# Patient Record
Sex: Female | Born: 1985 | Race: Black or African American | Hispanic: No | Marital: Single | Smoking: Never smoker
Health system: Southern US, Community
[De-identification: ages and names within clinical notes are randomized; demographics above are authoritative.]

## PROBLEM LIST (undated history)

## (undated) ENCOUNTER — Inpatient Hospital Stay (HOSPITAL_COMMUNITY): Payer: Self-pay

## (undated) DIAGNOSIS — J45909 Unspecified asthma, uncomplicated: Secondary | ICD-10-CM

## (undated) DIAGNOSIS — D649 Anemia, unspecified: Secondary | ICD-10-CM

## (undated) DIAGNOSIS — K219 Gastro-esophageal reflux disease without esophagitis: Secondary | ICD-10-CM

## (undated) DIAGNOSIS — N801 Endometriosis of ovary: Secondary | ICD-10-CM

## (undated) DIAGNOSIS — N80109 Endometriosis of ovary, unspecified side, unspecified depth: Secondary | ICD-10-CM

## (undated) HISTORY — PX: OTHER SURGICAL HISTORY: SHX169

---
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
## 2011-06-20 ENCOUNTER — Other Ambulatory Visit: Payer: Self-pay | Admitting: Occupational Medicine

## 2011-06-20 ENCOUNTER — Ambulatory Visit
Admission: RE | Admit: 2011-06-20 | Discharge: 2011-06-20 | Disposition: A | Discharge status: Home / Self Care | Payer: No Typology Code available for payment source | Source: Ambulatory Visit | Attending: Occupational Medicine | Admitting: Occupational Medicine

## 2011-06-20 DIAGNOSIS — Z021 Encounter for pre-employment examination: Secondary | ICD-10-CM

## 2012-01-23 ENCOUNTER — Encounter (HOSPITAL_BASED_OUTPATIENT_CLINIC_OR_DEPARTMENT_OTHER): Payer: Self-pay | Admitting: *Deleted

## 2012-01-23 ENCOUNTER — Emergency Department (HOSPITAL_BASED_OUTPATIENT_CLINIC_OR_DEPARTMENT_OTHER)
Admission: EM | Admit: 2012-01-23 | Discharge: 2012-01-23 | Disposition: A | Discharge status: Home / Self Care | Payer: 59 | Attending: Emergency Medicine | Admitting: Emergency Medicine

## 2012-01-23 DIAGNOSIS — T148 Other injury of unspecified body region: Secondary | ICD-10-CM | POA: Insufficient documentation

## 2012-01-23 DIAGNOSIS — J45909 Unspecified asthma, uncomplicated: Secondary | ICD-10-CM | POA: Insufficient documentation

## 2012-01-23 DIAGNOSIS — W57XXXA Bitten or stung by nonvenomous insect and other nonvenomous arthropods, initial encounter: Secondary | ICD-10-CM | POA: Insufficient documentation

## 2012-01-23 DIAGNOSIS — Z91013 Allergy to seafood: Secondary | ICD-10-CM | POA: Insufficient documentation

## 2012-01-23 HISTORY — DX: Unspecified asthma, uncomplicated: J45.909

## 2012-01-23 MED ORDER — PREDNISONE 50 MG PO TABS
60.0000 mg | ORAL_TABLET | Freq: Once | ORAL | Status: AC
  Administered 2012-01-23: 60 mg via ORAL
  Filled 2012-01-23: qty 1

## 2012-01-23 MED ORDER — DIPHENHYDRAMINE HCL 25 MG PO CAPS
25.0000 mg | ORAL_CAPSULE | Freq: Once | ORAL | Status: AC
  Administered 2012-01-23: 25 mg via ORAL
  Filled 2012-01-23: qty 1

## 2012-01-23 NOTE — ED Notes (Signed)
Pt has red, raised area to back of left leg.

## 2012-01-23 NOTE — ED Provider Notes (Signed)
History     CSN: [id number]  Arrival date & time 01/23/12  2053   First MD Initiated Contact with Patient 01/23/12 2135      Chief Complaint  Patient presents with  . Insect Bite    (Consider location/radiation/quality/duration/timing/severity/associated sxs/prior treatment) HPI Comments: Pt states that she was bit by something on the back of her left leg:pt states that she noticed some other area of redness on extremities:tp state that she has a history of serious reactions and she just wanted to make sure that she didn't need any treatment  Patient is a 26 y.o. female presenting with allergic reaction. The history is provided by the patient. No language interpreter was used.  Allergic Reaction The primary symptoms do not include wheezing, nausea or vomiting. The current episode started 6 to 12 hours ago. The problem has not changed since onset. The onset of the reaction was associated with insect bite/sting. Significant symptoms also include itching.    Past Medical History  Diagnosis Date  . Asthma     History reviewed. No pertinent past surgical history.  No family history on file.  History  Substance Use Topics  . Smoking status: Never Smoker   . Smokeless tobacco: Not on file  . Alcohol Use: No    OB History    Grav Para Term Preterm Abortions TAB SAB Ect Mult Living                  Review of Systems  Constitutional: Negative.   Respiratory: Negative for wheezing.   Cardiovascular: Negative.   Gastrointestinal: Negative for nausea and vomiting.  Skin: Positive for itching.  Neurological: Negative.     Allergies  Crab and Other  Home Medications   Current Outpatient Rx  Name Route Sig Dispense Refill  . DICLOFENAC SODIUM 75 MG PO TBEC Oral Take 75 mg by mouth 2 (two) times daily.      BP 118/65  Pulse 55  Temp 98.4 F (36.9 C) (Oral)  Resp 16  Ht 5\' 5"  (1.651 m)  Wt 177 lb (80.287 kg)  BMI 29.45 kg/m2  SpO2 100%  Physical Exam  Nursing  note and vitals reviewed. Constitutional: She appears well-developed and well-nourished.  HENT:  Mouth/Throat: Oropharynx is clear and moist.  Cardiovascular: Normal rate and regular rhythm.   Pulmonary/Chest: Effort normal and breath sounds normal.  Musculoskeletal: Normal range of motion.  Neurological: She is alert.  Skin:       Pt has to localized area of redness to the left posterior thigh and on on the right forearm    ED Course  Procedures (including critical care time)  Labs Reviewed - No data to display No results found.   1. Insect bite       MDM  Localized reaction:pt not having any breathing problem        Teressa Lower, NP 01/23/12 2201

## 2012-01-23 NOTE — ED Provider Notes (Signed)
Medical screening examination/treatment/procedure(s) were performed by non-physician practitioner and as supervising physician I was immediately available for consultation/collaboration.   Gavin Pound. Oletta Lamas, MD 01/23/12 2206

## 2012-04-10 ENCOUNTER — Other Ambulatory Visit: Payer: Self-pay | Admitting: Physician Assistant

## 2012-04-10 DIAGNOSIS — R1011 Right upper quadrant pain: Secondary | ICD-10-CM

## 2012-04-11 ENCOUNTER — Other Ambulatory Visit: Payer: Self-pay | Admitting: Physician Assistant

## 2012-04-11 ENCOUNTER — Ambulatory Visit
Admission: RE | Admit: 2012-04-11 | Discharge: 2012-04-11 | Disposition: A | Discharge status: Home / Self Care | Payer: 59 | Source: Ambulatory Visit | Attending: Physician Assistant | Admitting: Physician Assistant

## 2012-04-11 DIAGNOSIS — R1011 Right upper quadrant pain: Secondary | ICD-10-CM

## 2012-08-02 ENCOUNTER — Other Ambulatory Visit: Payer: Self-pay | Admitting: Physician Assistant

## 2012-08-02 ENCOUNTER — Other Ambulatory Visit (HOSPITAL_COMMUNITY)
Admission: RE | Admit: 2012-08-02 | Discharge: 2012-08-02 | Disposition: A | Discharge status: Home / Self Care | Payer: 59 | Source: Ambulatory Visit | Attending: Family Medicine | Admitting: Family Medicine

## 2012-08-02 DIAGNOSIS — Z124 Encounter for screening for malignant neoplasm of cervix: Secondary | ICD-10-CM | POA: Insufficient documentation

## 2012-08-08 ENCOUNTER — Ambulatory Visit (INDEPENDENT_AMBULATORY_CARE_PROVIDER_SITE_OTHER): Payer: 59 | Admitting: Physician Assistant

## 2012-08-08 ENCOUNTER — Telehealth: Payer: Self-pay

## 2012-08-08 VITALS — BP 113/70 | HR 87 | Temp 98.0°F | Resp 18 | Ht 66.0 in | Wt 187.8 lb

## 2012-08-08 DIAGNOSIS — J309 Allergic rhinitis, unspecified: Secondary | ICD-10-CM

## 2012-08-08 MED ORDER — MOMETASONE FUROATE 50 MCG/ACT NA SUSP: 2.0000 | Freq: Every day | NASAL | Status: DC

## 2012-08-08 MED ORDER — IPRATROPIUM BROMIDE 0.06 % NA SOLN: 2.0000 | Freq: Three times a day (TID) | NASAL | Status: DC

## 2012-08-08 MED ORDER — PREDNISONE 20 MG PO TABS: ORAL_TABLET | ORAL | Status: DC

## 2012-08-08 MED ORDER — FEXOFENADINE HCL 180 MG PO TABS: 180.0000 mg | ORAL_TABLET | Freq: Every day | ORAL | Status: DC

## 2012-08-08 NOTE — Progress Notes (Signed)
Patient ID: LATONDRA GEBHART MRN: [id number], DOB: [birth year], 27 y.o. Date of Encounter: 08/08/2012, 9:32 AM  Primary Physician: No primary provider on file.  Chief Complaint: Allergic rhinitis  HPI: 27 y.o. year old female with history below presents with flare up of allergic rhinitis. Long history of moderate allergies. Has been through back scratch testing in [place], [place]. "I tested positive for all by three things." Over the past two weeks she has noticed an increase in her nasal congestion, post nasal drip, sneezing, and itchy watery eyes. She has been mouth breathing at night causing a sore throat only in the morning that resolves as the day progresses. Over the past two weeks she had not been taking anything for her allergies until two days ago when she refilled her Nasonex prescription. She does have two indoor dogs, but states that she tolerates their dander well. She does change her air filters regularly and buys allergen filters. She does have a box spring cover on her bed. Uses allergen pillows. She has remained afebrile and without a cough. Her asthma has not been an issue for her in "years."    Past Medical History  Diagnosis Date  . Asthma      Home Meds: Prior to Admission medications   Medication Sig Start Date End Date Taking? Authorizing Provider  diclofenac (VOLTAREN) 75 MG EC tablet Take 75 mg by mouth 2 (two) times daily.    Historical Provider, MD                                Allergies:  Allergies  Allergen Reactions  . Crab (Shellfish Allergy) Other (See Comments)    Tongue swells.  . Other Itching    Pecans    History   Social History  . Marital Status: Single    Spouse Name: N/A    Number of Children: N/A  . Years of Education: N/A   Occupational History  . Not on file.   Social History Main Topics  . Smoking status: Never Smoker   . Smokeless tobacco: Not on file  . Alcohol Use: 1.0 oz/week    2 drink(s) per week  . Drug Use: No  .  Sexually Active: Not on file   Other Topics Concern  . Not on file   Social History Narrative  . No narrative on file     Review of Systems: Constitutional: negative for chills, fever, night sweats, weight changes, or fatigue  HEENT: see above Cardiovascular: negative for chest pain or palpitations Respiratory: negative for hemoptysis, wheezing, shortness of breath, or cough Dermatological: negative for rash Neurologic: negative for headache, dizziness, or syncope   Physical Exam: Blood pressure 113/70, pulse 87, temperature 98 F (36.7 C), temperature source Oral, resp. rate 18, height 5\' 6"  (1.676 m), weight 187 lb 12.8 oz (85.186 kg), last menstrual period 07/25/2012, SpO2 97.00%., Body mass index is 30.31 kg/(m^2). General: Well developed, well nourished, in no acute distress. Head: Normocephalic, atraumatic, eyes without discharge, sclera non-icteric, nares are pale and boggy. Bilateral auditory canals clear, TM's are without perforation, pearly grey and translucent with reflective cone of light bilaterally. Oral cavity moist, posterior pharynx with post nasal drip. No exudate, erythema, or peritonsillar abscess. Uvula midline.  Neck: Supple. No thyromegaly. Full ROM. No lymphadenopathy.  Lungs: Clear bilaterally to auscultation without wheezes, rales, or rhonchi. Breathing is unlabored. Heart: RRR with S1 S2. No murmurs, rubs, or gallops  appreciated. Msk:  Strength and tone normal for age. Extremities/Skin: Warm and dry. No clubbing or cyanosis. No edema. No rashes or suspicious lesions. Neuro: Alert and oriented X 3. Moves all extremities spontaneously. Gait is normal. CNII-XII grossly in tact. Psych:  Responds to questions appropriately with a normal affect.     ASSESSMENT AND PLAN:  27 y.o. year old female with moderate allergic rhinitis. -Prednisone 20 mg #18 3x3, 2x3, 1x3 no RF -Atrovent NS 0.06% 2 sprays each nare bid prn #1 RF 11 -Nasonex 1 spray each nare daily #1  RF 11 -Allegra 180 mg 1 po daily #30 RF 6 -Change to Claritin or Zyrtec after 6 months -Teaspoon of local honey daily -Change air filters regularly -Already has mattress covers -Avoid peak allergen times  Signed, Eula Listen, PA-C 08/08/2012 9:32 AM

## 2012-08-08 NOTE — Telephone Encounter (Signed)
PT STATES SHE HAD RECEIVED ALL HER MEDICINES EXCEPT SINGULAR. PLEASE CALL [phone]

## 2012-08-09 ENCOUNTER — Other Ambulatory Visit: Payer: Self-pay | Admitting: Radiology

## 2012-08-09 MED ORDER — MONTELUKAST SODIUM 10 MG PO TABS: 10.0000 mg | ORAL_TABLET | Freq: Every day | ORAL | Status: DC

## 2012-08-09 NOTE — Telephone Encounter (Signed)
Called patient, advised Rx sent in for her.

## 2012-08-09 NOTE — Telephone Encounter (Signed)
Authorized on 08/09/12.

## 2013-02-04 ENCOUNTER — Encounter (HOSPITAL_BASED_OUTPATIENT_CLINIC_OR_DEPARTMENT_OTHER): Payer: Self-pay | Admitting: Student

## 2013-02-04 ENCOUNTER — Emergency Department (HOSPITAL_BASED_OUTPATIENT_CLINIC_OR_DEPARTMENT_OTHER)
Admission: EM | Admit: 2013-02-04 | Discharge: 2013-02-04 | Disposition: A | Discharge status: Home / Self Care | Payer: 59 | Attending: Emergency Medicine | Admitting: Emergency Medicine

## 2013-02-04 DIAGNOSIS — Y9389 Activity, other specified: Secondary | ICD-10-CM | POA: Insufficient documentation

## 2013-02-04 DIAGNOSIS — M545 Low back pain, unspecified: Secondary | ICD-10-CM

## 2013-02-04 DIAGNOSIS — Z79899 Other long term (current) drug therapy: Secondary | ICD-10-CM | POA: Insufficient documentation

## 2013-02-04 DIAGNOSIS — J45909 Unspecified asthma, uncomplicated: Secondary | ICD-10-CM | POA: Insufficient documentation

## 2013-02-04 DIAGNOSIS — Y92410 Unspecified street and highway as the place of occurrence of the external cause: Secondary | ICD-10-CM | POA: Insufficient documentation

## 2013-02-04 DIAGNOSIS — IMO0002 Reserved for concepts with insufficient information to code with codable children: Secondary | ICD-10-CM | POA: Insufficient documentation

## 2013-02-04 MED ORDER — IBUPROFEN 600 MG PO TABS: 600.0000 mg | ORAL_TABLET | Freq: Four times a day (QID) | ORAL | Status: DC | PRN

## 2013-02-04 MED ORDER — DIAZEPAM 5 MG PO TABS: 5.0000 mg | ORAL_TABLET | Freq: Two times a day (BID) | ORAL | Status: DC

## 2013-02-04 NOTE — ED Provider Notes (Signed)
Medical screening examination/treatment/procedure(s) were performed by non-physician practitioner and as supervising physician I was immediately available for consultation/collaboration.  Nikala Walsworth, MD 02/04/13 1137 
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
## 2013-08-06 ENCOUNTER — Other Ambulatory Visit (HOSPITAL_COMMUNITY)
Admission: RE | Admit: 2013-08-06 | Discharge: 2013-08-06 | Disposition: A | Discharge status: Home / Self Care | Payer: 59 | Source: Ambulatory Visit | Attending: Family Medicine | Admitting: Family Medicine

## 2013-08-06 ENCOUNTER — Other Ambulatory Visit: Payer: Self-pay | Admitting: Physician Assistant

## 2013-08-06 DIAGNOSIS — Z124 Encounter for screening for malignant neoplasm of cervix: Secondary | ICD-10-CM | POA: Insufficient documentation

## 2013-09-05 ENCOUNTER — Encounter (HOSPITAL_COMMUNITY): Payer: Self-pay | Admitting: Emergency Medicine

## 2013-09-05 ENCOUNTER — Emergency Department (HOSPITAL_COMMUNITY)
Admission: EM | Admit: 2013-09-05 | Discharge: 2013-09-05 | Disposition: A | Discharge status: Home / Self Care | Payer: 59 | Attending: Emergency Medicine | Admitting: Emergency Medicine

## 2013-09-05 DIAGNOSIS — J45909 Unspecified asthma, uncomplicated: Secondary | ICD-10-CM | POA: Insufficient documentation

## 2013-09-05 DIAGNOSIS — Z791 Long term (current) use of non-steroidal anti-inflammatories (NSAID): Secondary | ICD-10-CM | POA: Insufficient documentation

## 2013-09-05 DIAGNOSIS — M79662 Pain in left lower leg: Secondary | ICD-10-CM

## 2013-09-05 DIAGNOSIS — M79609 Pain in unspecified limb: Secondary | ICD-10-CM

## 2013-09-05 DIAGNOSIS — Z79899 Other long term (current) drug therapy: Secondary | ICD-10-CM | POA: Insufficient documentation

## 2013-09-05 MED ORDER — NAPROXEN 500 MG PO TABS: 500.0000 mg | ORAL_TABLET | Freq: Two times a day (BID) | ORAL | Status: DC | PRN

## 2013-09-05 NOTE — Progress Notes (Signed)
*  PRELIMINARY RESULTS* Vascular Ultrasound Lower extremity venous duplex has been completed.  Preliminary findings: no evidence of DVT on the Left.   [name], RDMS, RVT  09/05/2013, 11:51 AM

## 2013-09-05 NOTE — ED Notes (Signed)
Pt c/o L calf pain while exercising for past few weeks. She is concerned about a blood clot because she takes birth control, denies hx blood clots. Denies any redness or swelling. States the pain shoots up her leg.

## 2013-09-05 NOTE — Discharge Instructions (Signed)

## 2013-09-05 NOTE — ED Provider Notes (Signed)
CSN: [id number]     Arrival date & time 09/05/13  1017 History   First MD Initiated Contact with Patient 09/05/13 1020     Chief Complaint  Patient presents with  . Leg Pain     (Consider location/radiation/quality/duration/timing/severity/associated sxs/prior Treatment) HPI  28 year old female presents with leg pain.  Pt reports gradual onset of dull pain to her L calf ongoing for nearly a month.  Pain is more noticeable when she ride the elliptical especially with downward motion.  Pain has been present only with certain movement (dorsiflexion) but for the past few days it has shot up her L thigh.  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  Denies fever, chills, rash, redness, cp, sob, numbness or weakness.  Does take birth control pill.  Denies recent surgery, prolonged bed rest, recent travel, prior hx of PE/DVT, hx of cancer or any recent trauma.  No specific treatment tried.   Past Medical History  Diagnosis Date  . Asthma    Past Surgical History  Procedure Laterality Date  . Laparoscopic ovaries      exploratory   No family history on file. History  Substance Use Topics  . Smoking status: Never Smoker   . Smokeless tobacco: Not on file  . Alcohol Use: 1.0 oz/week    2 drink(s) per week   OB History   Grav Para Term Preterm Abortions TAB SAB Ect Mult Living                 Review of Systems  All other systems reviewed and are negative.      Allergies  Crab and Other  Home Medications   Current Outpatient Rx  Name  Route  Sig  Dispense  Refill  . diazepam (VALIUM) 5 MG tablet   Oral   Take 1 tablet (5 mg total) by mouth 2 (two) times daily.   10 tablet   0   . diclofenac (VOLTAREN) 75 MG EC tablet   Oral   Take 75 mg by mouth 2 (two) times daily.         . fexofenadine (ALLEGRA) 180 MG tablet   Oral   Take 1 tablet (180 mg total) by mouth daily.   30 tablet   6   . ibuprofen (ADVIL,MOTRIN) 600 MG tablet    Oral   Take 1 tablet (600 mg total) by mouth every 6 (six) hours as needed for pain.   30 tablet   0    There were no vitals taken for this visit. Physical Exam  Nursing note and vitals reviewed. Constitutional: She appears well-developed and well-nourished. No distress.  HENT:  Head: Atraumatic.  Eyes: Conjunctivae are normal.  Neck: Neck supple.  Cardiovascular: Normal rate, regular rhythm and intact distal pulses.   Pulmonary/Chest: Effort normal and breath sounds normal.  Musculoskeletal: She exhibits tenderness (L calf: minimal tenderness with palpation and with dorsiflexion, no overlying skin changes, no edema, no erythema, no rash.  ). She exhibits no edema.  BLE: no palpable cords, erythema, edema, negative homan sign.    Intact dorsalis pedis and posterior tibialis on L leg.  Neurological: She is alert.  Sensation intact to L leg, patella DTR 2+, no foot drop.  Normal gait  Skin: No rash noted.  Psychiatric: She has a normal mood and affect.    ED Course  Procedures (including critical care time)  10:43 AM Pt here with progressive L  calf pain, no obvious signs of infection, no deformity, no edema but pt does take birth control pill, therefore will obtain doppler US to r/o DVT.  Otherwise pt is NVI.   12:31 PM Doppler shows no evidence of DVT.  Reassurance given.  Recommend RICE as treatment, suspect MSK etiology.  Ortho referral as needed.    Amanda Daniels, Amanda Daniels Female 1986/02/17 [id number]            Progress Notes by Chapman Fitch at 09/05/2013 11:51 AM    Author: Chapman Fitch Service: Vascular Lab Author Type: Cardiovascular Sonographer   Filed: 09/05/2013 11:52 AM Note Time: 09/05/2013 11:51 AM Status: Signed   Editor: Chapman Fitch (Cardiovascular Sonographer)      *PRELIMINARY RESULTS*  Vascular Ultrasound  Lower extremity venous duplex has been completed. Preliminary findings: no evidence of DVT on the Left.  [name], RDMS, RVT  09/05/2013, 11:51 AM          Labs Review Labs Reviewed - No data to display Imaging Review No results found.  EKG Interpretation   None       MDM   Final diagnoses:  Pain of left calf   BP 127/73  Pulse 77  Temp(Src) 98.6 F (37 C) (Oral)  Resp 20  SpO2 98%  I have reviewed nursing notes and vital signs. I personally reviewed the imaging tests through PACS system  I reviewed available ER/hospitalization records thought the EMR     Domenic Moras, [place] 09/05/13 1234

## 2013-09-06 NOTE — ED Provider Notes (Signed)
Medical screening examination/treatment/procedure(s) were performed by non-physician practitioner and as supervising physician I was immediately available for consultation/collaboration.  EKG Interpretation  None    Houston Siren III, MD 09/06/13 [phone]

## 2014-03-03 IMAGING — US US ABDOMEN LIMITED
1 series · 14 of 25 positions shown · non-contrast
Comparison: None.

CLINICAL DATA: Right upper quadrant pain, nausea, diarrhea

LIMITED ABDOMINAL ULTRASOUND - RIGHT UPPER QUADRANT

[Series 1: us abdomen limited · 0.32mm/px · 14 of 36 slices shown]
[im 1/36]
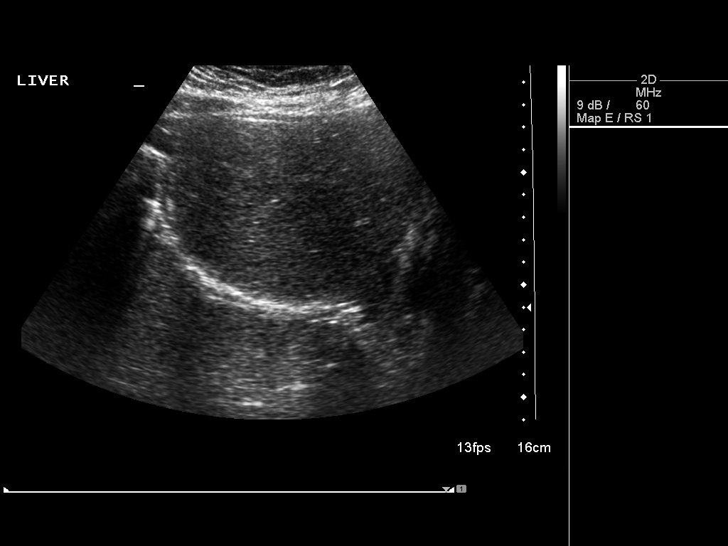
[im 3/36]
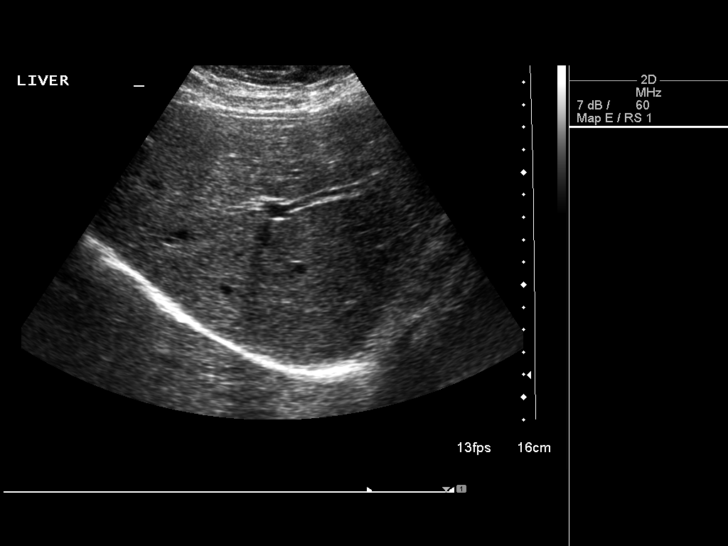
[im 6/36]
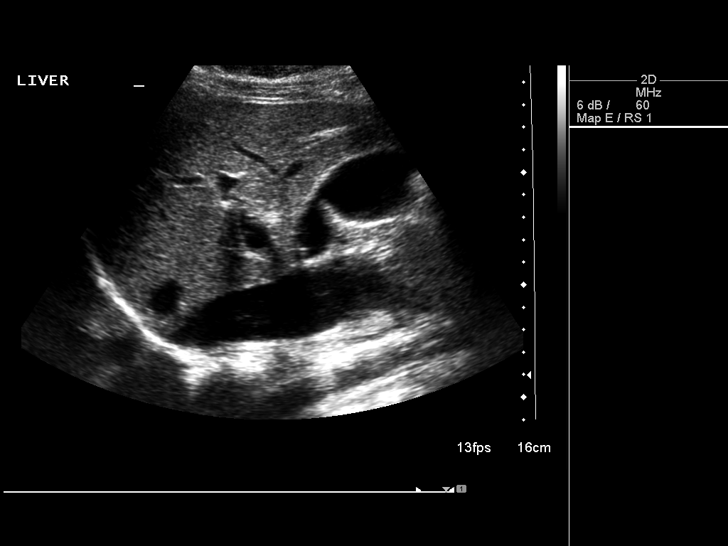
[im 9/36]
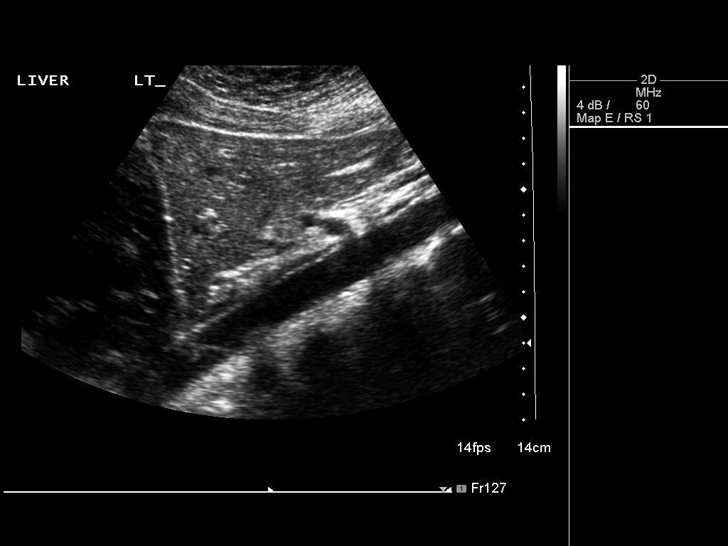
[im 12/36]
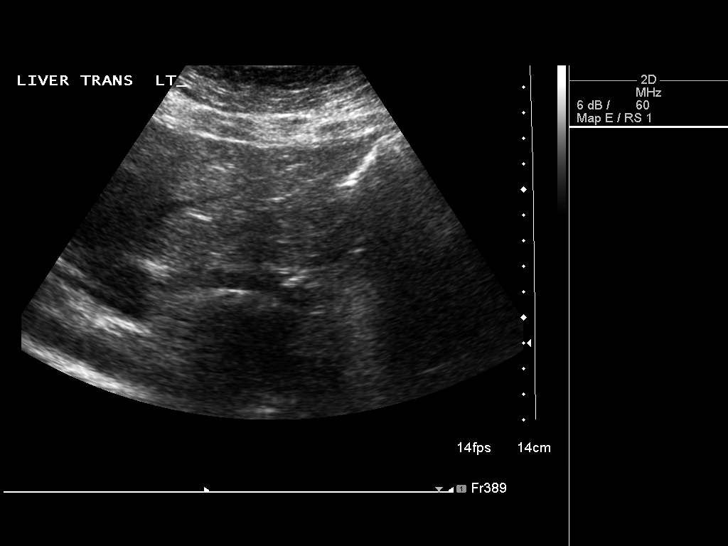
[im 14/36]
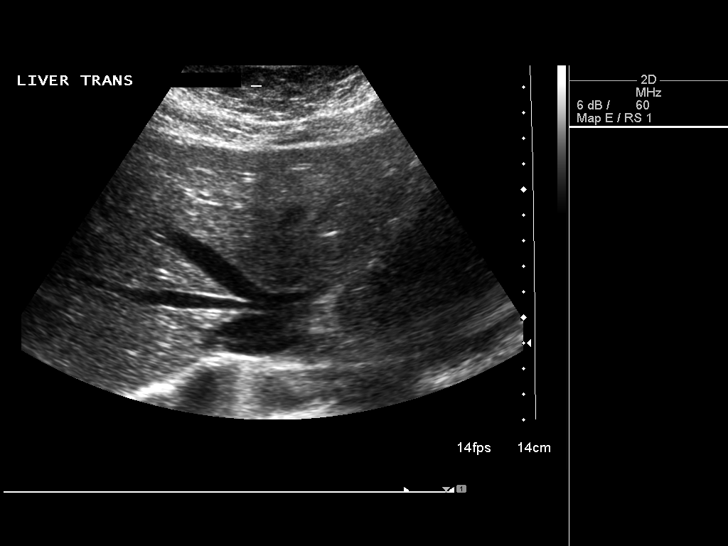
[im 17/36]
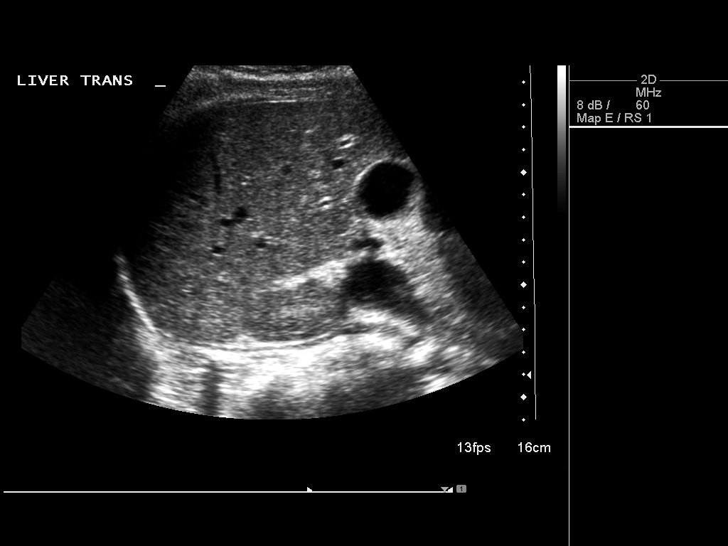
[im 19/36]
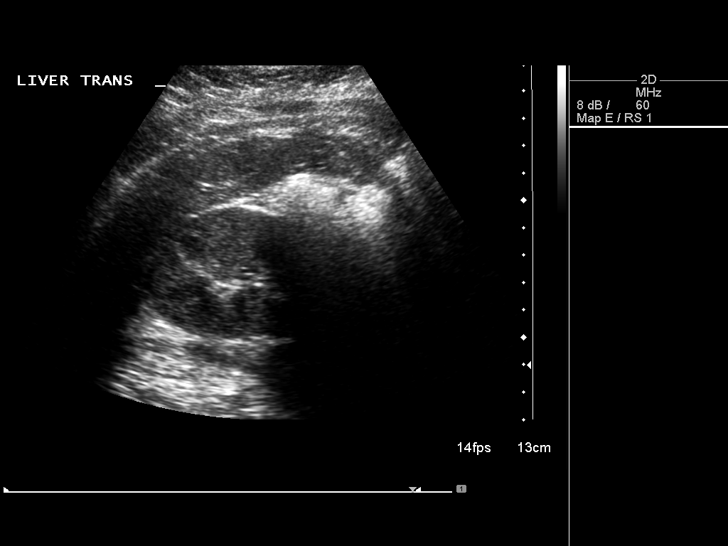
[im 22/36]
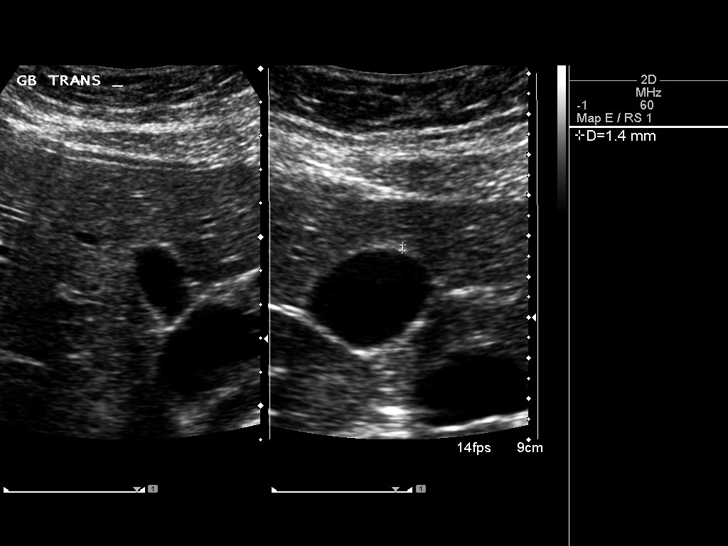
[im 24/36]
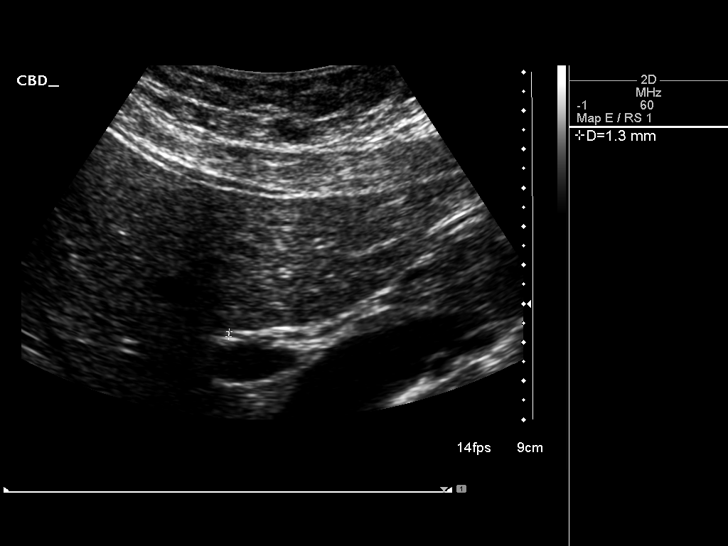
[im 27/36]
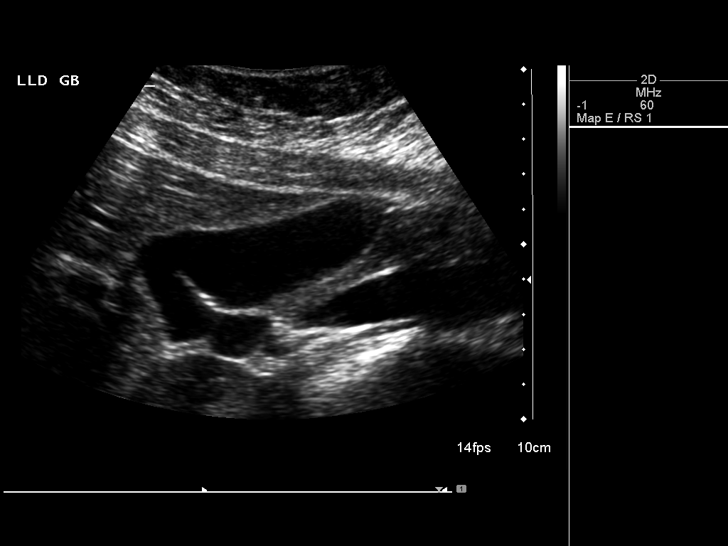
[im 30/36]
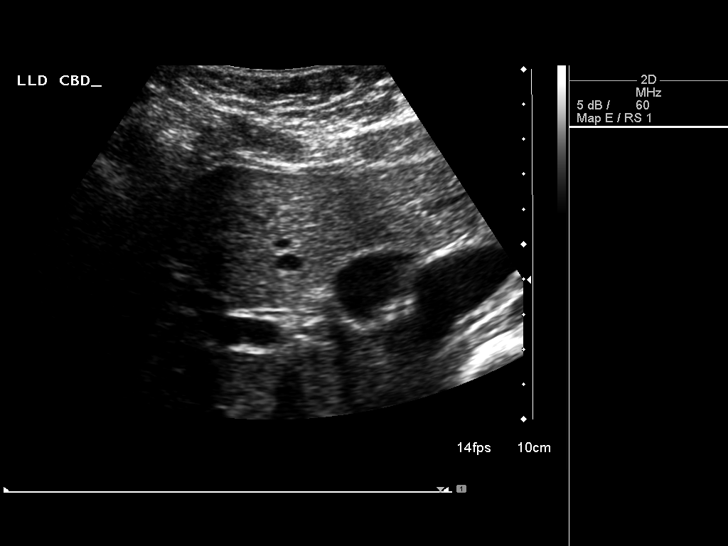
[im 33/36]
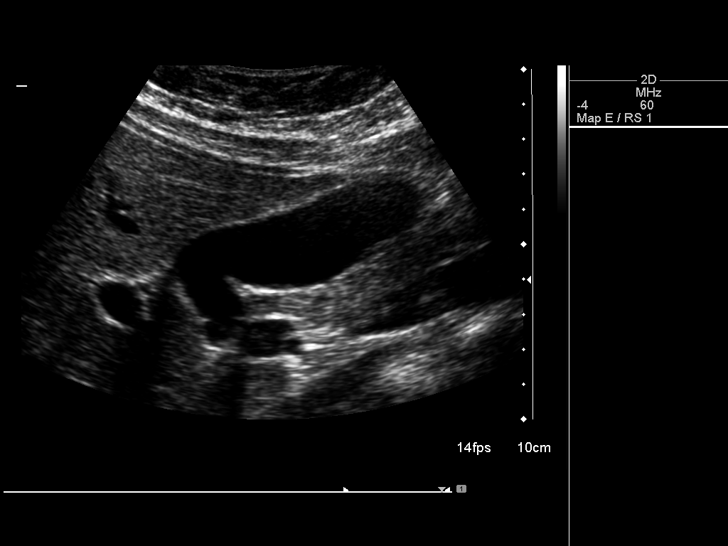
[im 36/36]
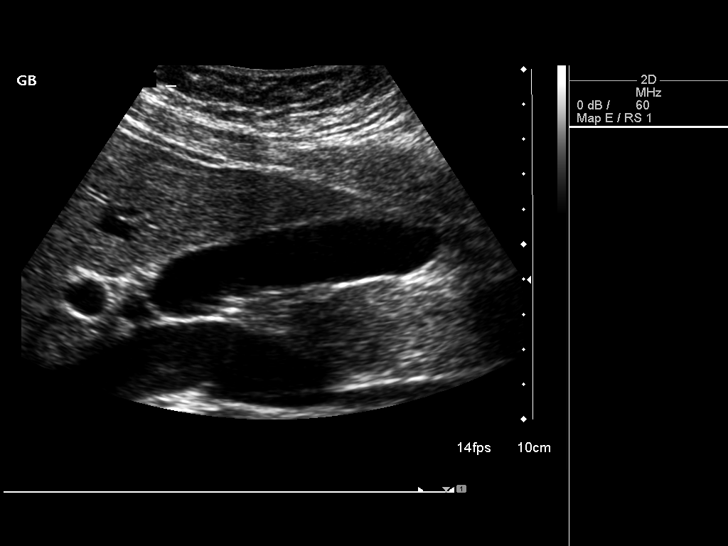

[14 of 25 positions shown; findings below may reference images not displayed]

FINDINGS: Gallbladder:  Normal appearance by ultrasound.  Negative for
gallstones or wall thickening.  No Murphy's sign.  Wall thickness
measures 1.5 mm.

Common bile duct:  1.4 mm diameter.  No dilatation obstruction.

Liver:  Normal echogenicity.  No focal abnormality or biliary
dilatation.

No upper abdominal free fluid.  Included views of the right kidney
demonstrate no hydronephrosis.
IMPRESSION: Normal right upper quadrant ultrasound

## 2016-03-10 ENCOUNTER — Other Ambulatory Visit: Payer: Self-pay | Admitting: Neurosurgery

## 2016-05-09 ENCOUNTER — Encounter (HOSPITAL_COMMUNITY): Payer: Self-pay

## 2016-05-09 ENCOUNTER — Encounter (HOSPITAL_COMMUNITY)
Admission: RE | Admit: 2016-05-09 | Discharge: 2016-05-09 | Disposition: A | Discharge status: Home / Self Care | Payer: 59 | Source: Ambulatory Visit | Attending: Neurosurgery | Admitting: Neurosurgery

## 2016-05-09 DIAGNOSIS — D179 Benign lipomatous neoplasm, unspecified: Secondary | ICD-10-CM | POA: Diagnosis present

## 2016-05-09 HISTORY — DX: Anemia, unspecified: D64.9

## 2016-05-09 HISTORY — DX: Endometriosis of ovary: N80.1

## 2016-05-09 HISTORY — DX: Endometriosis of ovary, unspecified side, unspecified depth: N80.109

## 2016-05-09 HISTORY — DX: Gastro-esophageal reflux disease without esophagitis: K21.9

## 2016-05-09 LAB — CBC WITH DIFFERENTIAL/PLATELET
BASOS PCT: 1 %
Basophils Absolute: 0.1 10*3/uL (ref 0.0–0.1)
EOS ABS: 0.3 10*3/uL (ref 0.0–0.7)
Eosinophils Relative: 3 %
HEMATOCRIT: 35.5 % — AB (ref 36.0–46.0)
HEMOGLOBIN: 11.9 g/dL — AB (ref 12.0–15.0)
Lymphocytes Relative: 24 %
Lymphs Abs: 2 10*3/uL (ref 0.7–4.0)
MCH: 28.8 pg (ref 26.0–34.0)
MCHC: 33.5 g/dL (ref 30.0–36.0)
MCV: 86 fL (ref 78.0–100.0)
MONOS PCT: 6 %
Monocytes Absolute: 0.5 10*3/uL (ref 0.1–1.0)
NEUTROS ABS: 5.4 10*3/uL (ref 1.7–7.7)
NEUTROS PCT: 66 %
Platelets: 309 10*3/uL (ref 150–400)
RBC: 4.13 MIL/uL (ref 3.87–5.11)
RDW: 12.7 % (ref 11.5–15.5)
WBC: 8.2 10*3/uL (ref 4.0–10.5)

## 2016-05-09 LAB — BASIC METABOLIC PANEL
ANION GAP: 7 (ref 5–15)
BUN: 9 mg/dL (ref 6–20)
CALCIUM: 9.2 mg/dL (ref 8.9–10.3)
CHLORIDE: 105 mmol/L (ref 101–111)
CO2: 25 mmol/L (ref 22–32)
Creatinine, Ser: 0.69 mg/dL (ref 0.44–1.00)
GFR calc non Af Amer: >60 mL/min (ref >60)
Glucose, Bld: 84 mg/dL (ref 65–99)
Potassium: 3.9 mmol/L (ref 3.5–5.1)
SODIUM: 137 mmol/L (ref 135–145)

## 2016-05-09 LAB — SURGICAL PCR SCREEN
MRSA, PCR: POSITIVE — AB
STAPHYLOCOCCUS AUREUS: POSITIVE — AB

## 2016-05-09 LAB — HCG, SERUM, QUALITATIVE: Preg, Serum: NEGATIVE
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
## 2016-05-09 NOTE — Pre-Procedure Instructions (Signed)
    Amanda Daniels  05/09/2016      [id number] - [place], [place] - [address] AT [address] [place] [address] Phone: [phone] Fax: [phone]    Your procedure is scheduled on Friday, November 3rd   Report to [facility] Admitting at 6:00 am             (posted surgery time is 8:00 - 10:00 am)   Call this number if you have problems the MORNING of surgery:  [phone]   Remember:  Do not eat food or drink liquids after midnight Thursday.   Take these medicines the morning of surgery with A SIP OF WATER : Allergra, Omeprazole              4-5 days prior to surgery, STOP TAKING any herbal supplements, vitamins, anti-inflammatories, blood thinners   Do not wear jewelry, make-up or nail polish.  Do not wear lotions, powders, perfumes, or deoderant.   Do not shave underarms & legs 48 hours prior to surgery.    Do not bring valuables to the hospital.  [facility] is not responsible for any belongings or valuables.  Contacts, dentures or bridgework may not be worn into surgery.  Leave your suitcase in the car.  After surgery it may be brought to your room. For patients admitted to the hospital, discharge time will be determined by your treatment team.  Please read over the following fact sheets that you were given. Pain Booklet, MRSA Information and Surgical Site Infection Prevention

## 2016-05-09 NOTE — Progress Notes (Signed)
Mup[facility]cin [facility]intment [facility] [address] for positive PCR of MRSA and Staph. Pt notified and voiced understanding.

## 2016-05-13 ENCOUNTER — Encounter (HOSPITAL_COMMUNITY): Payer: Self-pay | Admitting: Urology

## 2016-05-13 ENCOUNTER — Encounter (HOSPITAL_COMMUNITY)
Admission: RE | Disposition: A | Discharge status: Home / Self Care | Payer: Self-pay | Source: Ambulatory Visit | Attending: Neurosurgery

## 2016-05-13 ENCOUNTER — Ambulatory Visit (HOSPITAL_COMMUNITY): Payer: 59 | Admitting: Certified Registered"

## 2016-05-13 ENCOUNTER — Ambulatory Visit (HOSPITAL_COMMUNITY)
Admission: RE | Admit: 2016-05-13 | Discharge: 2016-05-13 | Disposition: A | Discharge status: Home / Self Care | Payer: 59 | Source: Ambulatory Visit | Attending: Neurosurgery | Admitting: Neurosurgery

## 2016-05-13 ENCOUNTER — Ambulatory Visit (HOSPITAL_COMMUNITY): Payer: 59 | Admitting: Emergency Medicine

## 2016-05-13 DIAGNOSIS — K219 Gastro-esophageal reflux disease without esophagitis: Secondary | ICD-10-CM | POA: Diagnosis not present

## 2016-05-13 DIAGNOSIS — G5702 Lesion of sciatic nerve, left lower limb: Secondary | ICD-10-CM | POA: Insufficient documentation

## 2016-05-13 DIAGNOSIS — D649 Anemia, unspecified: Secondary | ICD-10-CM | POA: Insufficient documentation

## 2016-05-13 DIAGNOSIS — Z91013 Allergy to seafood: Secondary | ICD-10-CM | POA: Insufficient documentation

## 2016-05-13 DIAGNOSIS — Z91018 Allergy to other foods: Secondary | ICD-10-CM | POA: Diagnosis not present

## 2016-05-13 DIAGNOSIS — J45909 Unspecified asthma, uncomplicated: Secondary | ICD-10-CM | POA: Insufficient documentation

## 2016-05-13 DIAGNOSIS — Z91040 Latex allergy status: Secondary | ICD-10-CM | POA: Insufficient documentation

## 2016-05-13 HISTORY — PX: SCIATIC NERVE EXPLORATION: SHX5373

## 2016-05-13 SURGERY — SCIATIC NERVE EXPLORATION
Anesthesia: General | Site: Thigh | Laterality: Left

## 2016-05-13 MED ORDER — CYCLOBENZAPRINE HCL 10 MG PO TABS: 10.0000 mg | ORAL_TABLET | Freq: Three times a day (TID) | ORAL | Status: DC | PRN

## 2016-05-13 MED ORDER — CEFAZOLIN SODIUM-DEXTROSE 2-4 GM/100ML-% IV SOLN
INTRAVENOUS | Status: AC
  Filled 2016-05-13: qty 100

## 2016-05-13 MED ORDER — DEXAMETHASONE SODIUM PHOSPHATE 10 MG/ML IJ SOLN
INTRAMUSCULAR | Status: DC | PRN
  Administered 2016-05-13: 5 mg via INTRAVENOUS

## 2016-05-13 MED ORDER — ONDANSETRON HCL 4 MG/2ML IJ SOLN
INTRAMUSCULAR | Status: AC
  Filled 2016-05-13: qty 2

## 2016-05-13 MED ORDER — OXYCODONE HCL 5 MG/5ML PO SOLN: 5.0000 mg | Freq: Once | ORAL | Status: AC | PRN

## 2016-05-13 MED ORDER — PROPOFOL 10 MG/ML IV BOLUS
INTRAVENOUS | Status: DC | PRN
  Administered 2016-05-13: 120 mg via INTRAVENOUS
  Administered 2016-05-13: 20 mg via INTRAVENOUS

## 2016-05-13 MED ORDER — GLYCOPYRROLATE 0.2 MG/ML IJ SOLN
INTRAMUSCULAR | Status: DC | PRN
  Administered 2016-05-13: 0.6 mg via INTRAVENOUS

## 2016-05-13 MED ORDER — HYDROCODONE-ACETAMINOPHEN 5-325 MG PO TABS: 1.0000 | ORAL_TABLET | ORAL | 0 refills | Status: DC | PRN

## 2016-05-13 MED ORDER — DEXAMETHASONE SODIUM PHOSPHATE 10 MG/ML IJ SOLN
INTRAMUSCULAR | Status: AC
  Filled 2016-05-13: qty 1

## 2016-05-13 MED ORDER — GLYCOPYRROLATE 0.2 MG/ML IV SOSY
PREFILLED_SYRINGE | INTRAVENOUS | Status: AC
  Filled 2016-05-13: qty 3

## 2016-05-13 MED ORDER — THROMBIN 5000 UNITS EX SOLR
CUTANEOUS | Status: AC
  Filled 2016-05-13: qty 5000

## 2016-05-13 MED ORDER — MIDAZOLAM HCL 2 MG/2ML IJ SOLN
INTRAMUSCULAR | Status: AC
  Filled 2016-05-13: qty 2

## 2016-05-13 MED ORDER — MIDAZOLAM HCL 2 MG/2ML IJ SOLN
INTRAMUSCULAR | Status: DC | PRN
  Administered 2016-05-13: 2 mg via INTRAVENOUS

## 2016-05-13 MED ORDER — LIDOCAINE HCL (CARDIAC) 20 MG/ML IV SOLN
INTRAVENOUS | Status: DC | PRN
  Administered 2016-05-13: 60 mg via INTRAVENOUS

## 2016-05-13 MED ORDER — LACTATED RINGERS IV SOLN
INTRAVENOUS | Status: DC
  Administered 2016-05-13: 07:00:00 via INTRAVENOUS

## 2016-05-13 MED ORDER — THROMBIN 20000 UNITS EX SOLR
CUTANEOUS | Status: AC
  Filled 2016-05-13: qty 20000

## 2016-05-13 MED ORDER — NORGESTIM-ETH ESTRAD TRIPHASIC 0.18/0.215/0.25 MG-25 MCG PO TABS: 1.0000 | ORAL_TABLET | Freq: Every day | ORAL | Status: DC

## 2016-05-13 MED ORDER — CEFAZOLIN SODIUM-DEXTROSE 2-4 GM/100ML-% IV SOLN
2.0000 g | INTRAVENOUS | Status: AC
  Administered 2016-05-13: 2 g via INTRAVENOUS

## 2016-05-13 MED ORDER — SODIUM CHLORIDE 0.9% FLUSH: 3.0000 mL | INTRAVENOUS | Status: DC | PRN

## 2016-05-13 MED ORDER — HYDROMORPHONE HCL 1 MG/ML IJ SOLN
0.2500 mg | INTRAMUSCULAR | Status: DC | PRN
  Administered 2016-05-13 (×4): 0.5 mg via INTRAVENOUS

## 2016-05-13 MED ORDER — ACETAMINOPHEN 160 MG/5ML PO SOLN
325.0000 mg | ORAL | Status: DC | PRN
  Filled 2016-05-13: qty 20.3

## 2016-05-13 MED ORDER — CHLORHEXIDINE GLUCONATE CLOTH 2 % EX PADS: 6.0000 | MEDICATED_PAD | Freq: Once | CUTANEOUS | Status: DC

## 2016-05-13 MED ORDER — THROMBIN 20000 UNITS EX SOLR
CUTANEOUS | Status: DC | PRN
  Administered 2016-05-13: 09:00:00 via TOPICAL

## 2016-05-13 MED ORDER — BUPIVACAINE HCL (PF) 0.25 % IJ SOLN
INTRAMUSCULAR | Status: DC | PRN
  Administered 2016-05-13: 30 mL

## 2016-05-13 MED ORDER — KETOROLAC TROMETHAMINE 30 MG/ML IJ SOLN
INTRAMUSCULAR | Status: AC
  Filled 2016-05-13: qty 1

## 2016-05-13 MED ORDER — KETOROLAC TROMETHAMINE 30 MG/ML IJ SOLN
INTRAMUSCULAR | Status: DC | PRN
  Administered 2016-05-13: 30 mg via INTRAVENOUS

## 2016-05-13 MED ORDER — ACETAMINOPHEN 325 MG PO TABS: 650.0000 mg | ORAL_TABLET | ORAL | Status: DC | PRN

## 2016-05-13 MED ORDER — HYDROCODONE-ACETAMINOPHEN 5-325 MG PO TABS: 1.0000 | ORAL_TABLET | ORAL | Status: DC | PRN

## 2016-05-13 MED ORDER — FENTANYL CITRATE (PF) 100 MCG/2ML IJ SOLN
INTRAMUSCULAR | Status: DC | PRN
  Administered 2016-05-13: 50 ug via INTRAVENOUS
  Administered 2016-05-13: 100 ug via INTRAVENOUS

## 2016-05-13 MED ORDER — SODIUM CHLORIDE 0.9 % IV SOLN: 250.0000 mL | INTRAVENOUS | Status: DC

## 2016-05-13 MED ORDER — FENTANYL CITRATE (PF) 100 MCG/2ML IJ SOLN
INTRAMUSCULAR | Status: AC
  Filled 2016-05-13: qty 4

## 2016-05-13 MED ORDER — BUPIVACAINE HCL (PF) 0.25 % IJ SOLN
INTRAMUSCULAR | Status: AC
  Filled 2016-05-13: qty 30

## 2016-05-13 MED ORDER — OXYCODONE-ACETAMINOPHEN 5-325 MG PO TABS: 1.0000 | ORAL_TABLET | ORAL | Status: DC | PRN

## 2016-05-13 MED ORDER — BACITRACIN ZINC 500 UNIT/GM EX OINT
TOPICAL_OINTMENT | CUTANEOUS | Status: AC
  Filled 2016-05-13: qty 28.35

## 2016-05-13 MED ORDER — PROPOFOL 10 MG/ML IV BOLUS
INTRAVENOUS | Status: AC
  Filled 2016-05-13: qty 20

## 2016-05-13 MED ORDER — KETOROLAC TROMETHAMINE 30 MG/ML IJ SOLN: 30.0000 mg | Freq: Four times a day (QID) | INTRAMUSCULAR | Status: DC

## 2016-05-13 MED ORDER — LIDOCAINE 2% (20 MG/ML) 5 ML SYRINGE
INTRAMUSCULAR | Status: AC
  Filled 2016-05-13: qty 5

## 2016-05-13 MED ORDER — CEFAZOLIN IN D5W 1 GM/50ML IV SOLN: 1.0000 g | Freq: Three times a day (TID) | INTRAVENOUS | Status: DC

## 2016-05-13 MED ORDER — SODIUM CHLORIDE 0.9 % IR SOLN
Status: DC | PRN
  Administered 2016-05-13: 09:00:00

## 2016-05-13 MED ORDER — MENTHOL 3 MG MT LOZG: 1.0000 | LOZENGE | OROMUCOSAL | Status: DC | PRN

## 2016-05-13 MED ORDER — OXYCODONE HCL 5 MG PO TABS
ORAL_TABLET | ORAL | Status: AC
  Filled 2016-05-13: qty 1

## 2016-05-13 MED ORDER — OXYCODONE HCL 5 MG PO TABS
5.0000 mg | ORAL_TABLET | Freq: Once | ORAL | Status: AC | PRN
  Administered 2016-05-13: 5 mg via ORAL

## 2016-05-13 MED ORDER — HYDROMORPHONE HCL 2 MG/ML IJ SOLN
INTRAMUSCULAR | Status: AC
  Filled 2016-05-13: qty 1

## 2016-05-13 MED ORDER — NEOSTIGMINE METHYLSULFATE 5 MG/5ML IV SOSY
PREFILLED_SYRINGE | INTRAVENOUS | Status: AC
  Filled 2016-05-13: qty 5

## 2016-05-13 MED ORDER — SODIUM CHLORIDE 0.9% FLUSH: 3.0000 mL | Freq: Two times a day (BID) | INTRAVENOUS | Status: DC

## 2016-05-13 MED ORDER — ONDANSETRON HCL 4 MG/2ML IJ SOLN
INTRAMUSCULAR | Status: DC | PRN
  Administered 2016-05-13: 4 mg via INTRAVENOUS

## 2016-05-13 MED ORDER — ACETAMINOPHEN 325 MG PO TABS: 325.0000 mg | ORAL_TABLET | ORAL | Status: DC | PRN

## 2016-05-13 MED ORDER — NEOSTIGMINE METHYLSULFATE 10 MG/10ML IV SOLN
INTRAVENOUS | Status: DC | PRN
  Administered 2016-05-13: 4 mg via INTRAVENOUS

## 2016-05-13 MED ORDER — ACETAMINOPHEN 650 MG RE SUPP: 650.0000 mg | RECTAL | Status: DC | PRN

## 2016-05-13 MED ORDER — ROCURONIUM BROMIDE 10 MG/ML (PF) SYRINGE
PREFILLED_SYRINGE | INTRAVENOUS | Status: AC
  Filled 2016-05-13: qty 10

## 2016-05-13 MED ORDER — ROCURONIUM BROMIDE 100 MG/10ML IV SOLN
INTRAVENOUS | Status: DC | PRN
  Administered 2016-05-13: 10 mg via INTRAVENOUS
  Administered 2016-05-13: 40 mg via INTRAVENOUS

## 2016-05-13 MED ORDER — HYDROMORPHONE HCL 1 MG/ML IJ SOLN: 0.5000 mg | INTRAMUSCULAR | Status: DC | PRN

## 2016-05-13 MED ORDER — 0.9 % SODIUM CHLORIDE (POUR BTL) OPTIME
TOPICAL | Status: DC | PRN
  Administered 2016-05-13: 1000 mL

## 2016-05-13 MED ORDER — PHENOL 1.4 % MT LIQD: 1.0000 | OROMUCOSAL | Status: DC | PRN

## 2016-05-13 MED ORDER — LORATADINE 10 MG PO TABS: 10.0000 mg | ORAL_TABLET | Freq: Every day | ORAL | Status: DC

## 2016-05-13 MED ORDER — ONDANSETRON HCL 4 MG/2ML IJ SOLN
4.0000 mg | INTRAMUSCULAR | Status: DC | PRN
  Administered 2016-05-13: 4 mg via INTRAVENOUS
  Filled 2016-05-13: qty 2

## 2016-05-13 MED ORDER — PANTOPRAZOLE SODIUM 40 MG PO TBEC: 40.0000 mg | DELAYED_RELEASE_TABLET | Freq: Every day | ORAL | Status: DC

## 2016-05-13 SURGICAL SUPPLY — 72 items
BAG DECANTER FOR FLEXI CONT (MISCELLANEOUS) ×2 IMPLANT
BENZOIN TINCTURE PRP APPL 2/3 (GAUZE/BANDAGES/DRESSINGS) ×2 IMPLANT
BLADE CLIPPER SURG (BLADE) IMPLANT
BLADE SURG 11 STRL SS (BLADE) IMPLANT
BUR CUTTER 7.0 ROUND (BURR) ×2 IMPLANT
BUR MATCHSTICK NEURO 3.0 LAGG (BURR) ×2 IMPLANT
CANISTER SUCT 3000ML PPV (MISCELLANEOUS) ×2 IMPLANT
DERMABOND ADVANCED (GAUZE/BANDAGES/DRESSINGS) ×1
DERMABOND ADVANCED .7 DNX12 (GAUZE/BANDAGES/DRESSINGS) ×1 IMPLANT
DRAPE INCISE IOBAN 66X45 STRL (DRAPES) ×2 IMPLANT
DRAPE LAPAROTOMY 100X72X124 (DRAPES) ×2 IMPLANT
DRAPE LAPAROTOMY T 102X78X121 (DRAPES) IMPLANT
DRAPE MICROSCOPE LEICA (MISCELLANEOUS) ×2 IMPLANT
DRAPE POUCH INSTRU U-SHP 10X18 (DRAPES) ×2 IMPLANT
DRAPE SURG 17X23 STRL (DRAPES) ×4 IMPLANT
DRSG OPSITE POSTOP 4X10 (GAUZE/BANDAGES/DRESSINGS) ×2 IMPLANT
DURAPREP 26ML APPLICATOR (WOUND CARE) ×4 IMPLANT
ELECT REM PT RETURN 9FT ADLT (ELECTROSURGICAL) ×2
ELECTRODE REM PT RTRN 9FT ADLT (ELECTROSURGICAL) ×1 IMPLANT
GAUZE SPONGE 4X4 12PLY STRL (GAUZE/BANDAGES/DRESSINGS) ×2 IMPLANT
GAUZE SPONGE 4X4 16PLY XRAY LF (GAUZE/BANDAGES/DRESSINGS) IMPLANT
GLOVE BIOGEL PI IND STRL 7.0 (GLOVE) ×1 IMPLANT
GLOVE BIOGEL PI IND STRL 7.5 (GLOVE) ×1 IMPLANT
GLOVE BIOGEL PI IND STRL 8.5 (GLOVE) ×1 IMPLANT
GLOVE BIOGEL PI INDICATOR 7.0 (GLOVE) ×1
GLOVE BIOGEL PI INDICATOR 7.5 (GLOVE) ×1
GLOVE BIOGEL PI INDICATOR 8.5 (GLOVE) ×1
GLOVE ECLIPSE 9.0 STRL (GLOVE) IMPLANT
GLOVE EXAM NITRILE LRG STRL (GLOVE) IMPLANT
GLOVE EXAM NITRILE XL STR (GLOVE) IMPLANT
GLOVE EXAM NITRILE XS STR PU (GLOVE) IMPLANT
GLOVE SS PI 9.0 STRL (GLOVE) ×4 IMPLANT
GLOVE SURG SS PI 6.5 STRL IVOR (GLOVE) ×6 IMPLANT
GLOVE SURG SS PI 7.0 STRL IVOR (GLOVE) ×6 IMPLANT
GLOVE SURG SS PI 7.5 STRL IVOR (GLOVE) ×2 IMPLANT
GLOVE SURG SS PI 8.0 STRL IVOR (GLOVE) ×2 IMPLANT
GOWN STRL REUS W/ TWL LRG LVL3 (GOWN DISPOSABLE) ×1 IMPLANT
GOWN STRL REUS W/ TWL XL LVL3 (GOWN DISPOSABLE) ×4 IMPLANT
GOWN STRL REUS W/TWL 2XL LVL3 (GOWN DISPOSABLE) IMPLANT
GOWN STRL REUS W/TWL LRG LVL3 (GOWN DISPOSABLE) ×1
GOWN STRL REUS W/TWL XL LVL3 (GOWN DISPOSABLE) ×4
HEMOSTAT SURGICEL 2X14 (HEMOSTASIS) IMPLANT
KIT BASIN OR (CUSTOM PROCEDURE TRAY) ×2 IMPLANT
KIT ROOM TURNOVER OR (KITS) ×2 IMPLANT
NEEDLE HYPO 22GX1.5 SAFETY (NEEDLE) ×2 IMPLANT
NEEDLE HYPO 25X1 1.5 SAFETY (NEEDLE) ×2 IMPLANT
NEEDLE SPNL 20GX3.5 QUINCKE YW (NEEDLE) IMPLANT
NS IRRIG 1000ML POUR BTL (IV SOLUTION) ×2 IMPLANT
PACK LAMINECTOMY NEURO (CUSTOM PROCEDURE TRAY) ×2 IMPLANT
PATTIES SURGICAL .5 X.5 (GAUZE/BANDAGES/DRESSINGS) IMPLANT
PATTIES SURGICAL .5 X3 (DISPOSABLE) IMPLANT
RUBBERBAND STERILE (MISCELLANEOUS) ×4 IMPLANT
SPONGE LAP 4X18 X RAY DECT (DISPOSABLE) IMPLANT
SPONGE SURGIFOAM ABS GEL 100 (HEMOSTASIS) ×2 IMPLANT
STAPLER VISISTAT 35W (STAPLE) IMPLANT
STRIP CLOSURE SKIN 1/2X4 (GAUZE/BANDAGES/DRESSINGS) ×4 IMPLANT
SUT BONE WAX W31G (SUTURE) IMPLANT
SUT ETHILON 4 0 PS 2 18 (SUTURE) IMPLANT
SUT NURALON 4 0 TR CR/8 (SUTURE) IMPLANT
SUT PROLENE 6 0 BV (SUTURE) IMPLANT
SUT VIC AB 0 CT1 18XCR BRD8 (SUTURE) ×1 IMPLANT
SUT VIC AB 0 CT1 8-18 (SUTURE) ×1
SUT VIC AB 1 CT1 18XCR BRD 8 (SUTURE) ×2 IMPLANT
SUT VIC AB 1 CT1 8-18 (SUTURE) ×2
SUT VIC AB 2-0 CT1 18 (SUTURE) ×2 IMPLANT
SUT VIC AB 3-0 SH 8-18 (SUTURE) ×4 IMPLANT
SUT VICRYL 4-0 PS2 18IN ABS (SUTURE) ×2 IMPLANT
TOWEL OR 17X24 6PK STRL BLUE (TOWEL DISPOSABLE) ×4 IMPLANT
TOWEL OR 17X26 10 PK STRL BLUE (TOWEL DISPOSABLE) ×2 IMPLANT
TRAY FOLEY W/METER SILVER 16FR (SET/KITS/TRAYS/PACK) IMPLANT
TUBE CONNECTING 12X1/4 (SUCTIONS) ×2 IMPLANT
WATER STERILE IRR 1000ML POUR (IV SOLUTION) ×2 IMPLANT

## 2016-05-13 NOTE — Brief Op Note (Signed)
05/13/2016  9:29 AM  PATIENT:  Amanda Daniels  30 y.o. female  PRE-OPERATIVE DIAGNOSIS:  Sciatic nerve lesion  POST-OPERATIVE DIAGNOSIS:  Sciatic nerve lesion  PROCEDURE:  Procedure(s): Left Sciatic Nerve Tumor Resection (Left)  SURGEON:  Surgeon(s) and Role:    * Earnie Larsson, MD - Primary    * Kary Kos, MD - Assisting  PHYSICIAN ASSISTANT:   ASSISTANTS:    ANESTHESIA:   general  EBL:  Total I/O In: 500 [I.V.:500] Out: -   BLOOD ADMINISTERED:none  DRAINS: none   LOCAL MEDICATIONS USED:  MARCAINE     SPECIMEN:  Source of Specimen:  Right thigh  DISPOSITION OF SPECIMEN:  PATHOLOGY  COUNTS:  YES  TOURNIQUET:  * No tourniquets in log *  DICTATION: .Dragon Dictation  PLAN OF CARE: Admit for overnight observation  PATIENT DISPOSITION:  PACU - hemodynamically stable.   Delay start of Pharmacological VTE agent (>24hrs) due to surgical blood loss or risk of bleeding: yes

## 2016-05-13 NOTE — Anesthesia Procedure Notes (Signed)
Procedure Name: Intubation Date/Time: 05/13/2016 8:03 AM Performed by: Sampson Si E Pre-anesthesia Checklist: Patient identified, Emergency Drugs available, Suction available and Patient being monitored Patient Re-evaluated:Patient Re-evaluated prior to inductionOxygen Delivery Method: Circle system utilized Preoxygenation: Pre-oxygenation with 100% oxygen Intubation Type: IV induction Ventilation: Mask ventilation without difficulty Laryngoscope Size: Mac and 3 Grade View: Grade I Tube type: Oral Tube size: 7.0 mm Number of attempts: 1 Airway Equipment and Method: Stylet and Oral airway Placement Confirmation: ETT inserted through vocal cords under direct vision,  positive ETCO2 and breath sounds checked- equal and bilateral Secured at: 21 cm Tube secured with: Tape Dental Injury: Teeth and Oropharynx as per pre-operative assessment

## 2016-05-13 NOTE — Discharge Summary (Signed)
Physician Discharge Summary  Patient ID: Amanda Daniels MRN: [id number] DOB/AGE: [birth year] 30 y.o.  Admit date: 05/13/2016 Discharge date: 05/13/2016  Admission Diagnoses:  Discharge Diagnoses:  Active Problems:   Sciatic nerve lesion, left   Discharged Condition: good  Hospital Course: Patient admitted to hospital where she underwent uncompensated left-sided resection of sciatic nerve tumor. 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 Postoperatively patient is doing well. Neurologically intact. Pain well controlled. Ambulatory. Ready for discharge home.  Consults:   Significant Diagnostic Studies:   Treatments:   Discharge Exam: Blood pressure (!) 102/54, pulse 80, temperature 97.8 F (36.6 C), resp. rate 18, height 5\' 5"  (1.651 m), weight 80 kg (176 lb 5 oz), last menstrual period 04/17/2016, SpO2 100 %. Awake and alert. Oriented and appropriate. Cranial nerve function intact. Motor and sensory function extremities normal. Wound clean and dry. Chest and abdomen benign.  Disposition: 01-Home or Self Care     Medication List    TAKE these medications   COLLAGEN PO Take 1 tablet by mouth daily.   fexofenadine 180 MG tablet Commonly known as:  ALLEGRA Take 180 mg by mouth daily.   Fish Oil 1000 MG Caps Take 1,000 mg by mouth daily.   HYDROcodone-acetaminophen 5-325 MG tablet Commonly known as:  NORCO/VICODIN Take 1-2 tablets by mouth every 4 (four) hours as needed (mild pain).   naproxen 500 MG tablet Commonly known as:  NAPROSYN Take 1 tablet (500 mg total) by mouth 2 (two) times daily as needed for moderate pain.   omeprazole 20 MG capsule Commonly known as:  PRILOSEC Take 20 mg by mouth daily.   ORTHO TRI-CYCLEN LO 0.18/0.215/0.25 MG-25 MCG tab Generic drug:  Norgestimate-Ethinyl Estradiol Triphasic Take 1 tablet by mouth at bedtime.   TURMERIC PO Take 1 tablet by mouth daily.      Follow-up Information    Charlie Pitter, MD  .   Specialty:  Neurosurgery Contact information: [address] [place] [address] [phone]           Signed: Charlie Pitter 05/13/2016, 4:33 PM

## 2016-05-13 NOTE — H&P (Addendum)
Amanda Daniels is an 30 y.o. female.   Chief Complaint: Right thigh pain HPI: 30 year old female with no history of malignancy or neurocutaneous syndrome presents with progressive left thigh pain radiating into her distal thigh leg and foot. Symptoms are aggravated by sitting or physical activity. Workup has demonstrated evidence of a tumor involving her sciatic nerve at the level of the gluteal crease. The patient has no ongoing symptoms of numbness or weakness. She has no other lesions. She presents now for operative exploration hopeful resection of this tumor which appears on imaging to be consistent with a lipoma.  Past Medical History:  Diagnosis Date  . Anemia   . Asthma    last attack 10 yrs ago  . Endometriosis of ovary    back in 2007  . GERD (gastroesophageal reflux disease)     Past Surgical History:  Procedure Laterality Date  . laparoscopic ovaries     exploratory    History reviewed. No pertinent family history. Social History:  reports that she has never smoked. She has never used smokeless tobacco. She reports that she drinks about 1.0 oz of alcohol per week . She reports that she does not use drugs.  Allergies:  Allergies  Allergen Reactions  . Crab [Shellfish Allergy] Other (See Comments)    Tongue swells.  . Latex Itching    inflamation  . Other Itching    Pecans    Medications Prior to Admission  Medication Sig Dispense Refill  . COLLAGEN PO Take 1 tablet by mouth daily.    . fexofenadine (ALLEGRA) 180 MG tablet Take 180 mg by mouth daily.     . Norgestimate-Ethinyl Estradiol Triphasic (ORTHO TRI-CYCLEN LO) 0.18/0.215/0.25 MG-25 MCG tab Take 1 tablet by mouth at bedtime.    . Omega-3 Fatty Acids (FISH OIL) 1000 MG CAPS Take 1,000 mg by mouth daily.     Marland Kitchen omeprazole (PRILOSEC) 20 MG capsule Take 20 mg by mouth daily.    . TURMERIC PO Take 1 tablet by mouth daily.    . naproxen (NAPROSYN) 500 MG tablet Take 1 tablet (500 mg total) by mouth 2 (two) times  daily as needed for moderate pain. (Patient not taking: Reported on 05/05/2016) 30 tablet 0    No results found for this or any previous visit (from the past 48 hour(s)). No results found.  Pertinent items noted in HPI and remainder of comprehensive ROS otherwise negative.  Blood pressure 114/62, pulse (!) 46, temperature 97.6 F (36.4 C), temperature source Oral, resp. rate 20, height 5\' 5"  (1.651 m), weight 80 kg (176 lb 5 oz), last menstrual period 04/17/2016, SpO2 100 %.  The patient is awake and alert. She is oriented and appropriate. Her cranial nerve function is intact. Her speech is fluent. Her judgment and insight are intact. Motor examination 5/5 bilaterally. Sensory examination normal. Reflexes normal. Examination head ears eyes and thirds unremarkable chest and abdomen are benign. Extremities are free from injury deformity. She does have some tenderness at the level of her left gluteal crease with some reproduction of her symptoms with deep palpation. 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 Plan left sciatic nerve exploration with hopeful resection of tumor. Risks and benefits of been explained. Patient wishes to proceed.  Before surgery had begun, I recognized that the consent was for a right sided surgery,I discussed situation with the patient's family. I changed the consent the left side and we proceeded.  Kasheem Toner A 05/13/2016, 7:38 AM

## 2016-05-13 NOTE — Progress Notes (Signed)
Pt doing well. Pt and mother given D/C instructions with Rx's, verbal understanding was provided. Pt's incision is clean and dry with no sign of infection. Pt's IV was removed prior to D/C. Pt D/C'd home via wheelchair @ 1705 per MD order. Pt is stable @ D/C and has no other needs at this time. Holli Humbles, RN

## 2016-05-13 NOTE — Discharge Instructions (Signed)

## 2016-05-13 NOTE — Op Note (Signed)
Date of procedure: 05/13/2016  Date of dictation: Same  Service: Neurosurgery  Preoperative diagnosis: Left sciatic nerve tumor  Postoperative diagnosis: Same  Procedure Name: Exploration of left sciatic nerve in the left buttock and proximal thigh. Resection of neoplasm.  Microdissection  Surgeon:Leotta Weingarten A.Emmersyn Kratzke, M.D.  Asst. Surgeon: Saintclair Halsted  Anesthesia: General  Indication: 30 year old female with left buttock pain with radiation. Workup demonstrates evidence of a fatty mass associated with her left sciatic nerve. Patient presents now for resection.  Operative note: After induction of anesthesia, patient position prone on the bolsters inappropriately padded. Buttock and posterior thigh were prepped and draped sterilely. Curvilinear incision was made over the left buttocks and proximal thigh. Dissection proceeded down to the gluteus maximus muscle. This was partially divided. Dissection then proceeded distally into the proximal thigh and a plane along the medial border of the biceps femoris was also developed. The residual gluteus maximus was elevated and retracted superiorly. I then dissected deep toward the sciatic nerve. I encountered a large fatty mass. I dissected this circumferentially. I identified the sciatic nerve. The tumor itself was attached to the sciatic nerve but did not infiltrate the sciatic nerve. Using the microscope for microdissection I dissected the fatty growth away from the sciatic nerve and resected it completely. There was no evidence of injury to the sciatic nerve or branches. At this point a gross total resection had been achieved. The wound was then irrigated with and like solution. Gluteus maximus was reapproximated with #1 Vicryl in horizontal mattress fashion. Subcutaneous fat and skin were then closed in layers. Steri-Strips and sterile dressing were applied. No apparent complications. Patient tolerated procedure well and she returns to the recovery room  postop.

## 2016-05-13 NOTE — Anesthesia Postprocedure Evaluation (Signed)
Anesthesia Post Note  Patient: Amanda Daniels  Procedure(s) Performed: Procedure(s) (LRB): Left Sciatic Nerve Tumor Resection (Left)  Patient location during evaluation: PACU Anesthesia Type: General Level of consciousness: awake Pain management: pain level controlled Vital Signs Assessment: post-procedure vital signs reviewed and stable Respiratory status: spontaneous breathing Cardiovascular status: stable Postop Assessment: no signs of nausea or vomiting Anesthetic complications: no    Last Vitals:  Vitals:   05/13/16 1042 05/13/16 1045  BP: (!) 100/54   Pulse: (!) 55 (!) 52  Resp: 14 14  Temp: 36.4 C     Last Pain:  Vitals:   05/13/16 1115  TempSrc:   PainSc: 3                  Shannia Jacuinde

## 2016-05-13 NOTE — Anesthesia Preprocedure Evaluation (Addendum)
Anesthesia Evaluation  Patient identified by MRN, date of birth, ID band Patient awake    Reviewed: Allergy & Precautions, NPO status , Patient's Chart, lab work & pertinent test results  History of Anesthesia Complications Negative for: history of anesthetic complications  Airway Mallampati: II  TM Distance: >3 FB Neck ROM: Full    Dental  (+) Teeth Intact, Dental Advisory Given   Pulmonary asthma ,    breath sounds clear to auscultation       Cardiovascular negative cardio ROS   Rhythm:Regular Rate:Normal     Neuro/Psych  Neuromuscular disease negative psych ROS   GI/Hepatic Neg liver ROS, GERD  Medicated and Controlled,  Endo/Other  negative endocrine ROS  Renal/GU negative Renal ROS     Musculoskeletal   Abdominal   Peds  Hematology  (+) anemia ,   Anesthesia Other Findings   Reproductive/Obstetrics                            Anesthesia Physical Anesthesia Plan  ASA: II  Anesthesia Plan: General   Post-op Pain Management:    Induction: Intravenous  Airway Management Planned: Oral ETT  Additional Equipment: None  Intra-op Plan:   Post-operative Plan: Extubation in OR  Informed Consent: I have reviewed the patients History and Physical, chart, labs and discussed the procedure including the risks, benefits and alternatives for the proposed anesthesia with the patient or authorized representative who has indicated his/her understanding and acceptance.   Dental advisory given  Plan Discussed with: CRNA and Surgeon  Anesthesia Plan Comments:         Anesthesia Quick Evaluation

## 2016-05-13 NOTE — Transfer of Care (Signed)
Immediate Anesthesia Transfer of Care Note  Patient: Amanda Daniels  Procedure(s) Performed: Procedure(s): Left Sciatic Nerve Tumor Resection (Left)  Patient Location: PACU  Anesthesia Type:General  Level of Consciousness: awake, alert  and oriented  Airway & Oxygen Therapy: Patient Spontanous Breathing and Patient connected to nasal cannula oxygen  Post-op Assessment: Report given to RN and Patient moving all extremities X 4  Post vital signs: Reviewed and stable  Last Vitals:  Vitals:   05/13/16 0645 05/13/16 0942  BP: 114/62   Pulse: (!) 46   Resp: 20   Temp: 36.4 C (P) 36.1 C    Last Pain:  Vitals:   05/13/16 0707  TempSrc:   PainSc: 6          Complications: No apparent anesthesia complications

## 2016-05-14 ENCOUNTER — Encounter (HOSPITAL_COMMUNITY): Payer: Self-pay | Admitting: Neurosurgery

## 2016-07-14 DIAGNOSIS — N760 Acute vaginitis: Secondary | ICD-10-CM | POA: Diagnosis not present

## 2016-07-14 DIAGNOSIS — R3129 Other microscopic hematuria: Secondary | ICD-10-CM | POA: Diagnosis not present

## 2016-07-22 DIAGNOSIS — R3129 Other microscopic hematuria: Secondary | ICD-10-CM | POA: Diagnosis not present

## 2016-10-04 DIAGNOSIS — R350 Frequency of micturition: Secondary | ICD-10-CM | POA: Diagnosis not present

## 2017-04-06 ENCOUNTER — Ambulatory Visit: Payer: Self-pay

## 2017-04-06 ENCOUNTER — Other Ambulatory Visit: Payer: Self-pay | Admitting: Occupational Medicine

## 2017-04-06 DIAGNOSIS — M79641 Pain in right hand: Secondary | ICD-10-CM

## 2017-04-25 DIAGNOSIS — H04123 Dry eye syndrome of bilateral lacrimal glands: Secondary | ICD-10-CM | POA: Diagnosis not present

## 2017-06-28 DIAGNOSIS — M25512 Pain in left shoulder: Secondary | ICD-10-CM | POA: Diagnosis not present

## 2017-07-05 DIAGNOSIS — M25512 Pain in left shoulder: Secondary | ICD-10-CM | POA: Diagnosis not present

## 2017-07-14 DIAGNOSIS — M25512 Pain in left shoulder: Secondary | ICD-10-CM | POA: Diagnosis not present

## 2017-08-04 DIAGNOSIS — Z01419 Encounter for gynecological examination (general) (routine) without abnormal findings: Secondary | ICD-10-CM | POA: Diagnosis not present

## 2017-09-18 DIAGNOSIS — L700 Acne vulgaris: Secondary | ICD-10-CM | POA: Diagnosis not present

## 2017-09-18 DIAGNOSIS — D225 Melanocytic nevi of trunk: Secondary | ICD-10-CM | POA: Diagnosis not present

## 2017-09-20 DIAGNOSIS — M25512 Pain in left shoulder: Secondary | ICD-10-CM | POA: Diagnosis not present

## 2017-09-20 DIAGNOSIS — M25511 Pain in right shoulder: Secondary | ICD-10-CM | POA: Diagnosis not present

## 2017-09-28 DIAGNOSIS — M24511 Contracture, right shoulder: Secondary | ICD-10-CM | POA: Diagnosis not present

## 2017-09-28 DIAGNOSIS — M9907 Segmental and somatic dysfunction of upper extremity: Secondary | ICD-10-CM | POA: Diagnosis not present

## 2017-09-28 DIAGNOSIS — M9902 Segmental and somatic dysfunction of thoracic region: Secondary | ICD-10-CM | POA: Diagnosis not present

## 2017-10-03 DIAGNOSIS — M9907 Segmental and somatic dysfunction of upper extremity: Secondary | ICD-10-CM | POA: Diagnosis not present

## 2017-10-03 DIAGNOSIS — M9902 Segmental and somatic dysfunction of thoracic region: Secondary | ICD-10-CM | POA: Diagnosis not present

## 2017-10-03 DIAGNOSIS — M24511 Contracture, right shoulder: Secondary | ICD-10-CM | POA: Diagnosis not present

## 2017-11-22 DIAGNOSIS — M545 Low back pain: Secondary | ICD-10-CM | POA: Diagnosis not present

## 2017-11-22 DIAGNOSIS — M25552 Pain in left hip: Secondary | ICD-10-CM | POA: Diagnosis not present

## 2017-11-29 DIAGNOSIS — M25552 Pain in left hip: Secondary | ICD-10-CM | POA: Diagnosis not present

## 2017-11-29 DIAGNOSIS — M545 Low back pain: Secondary | ICD-10-CM | POA: Diagnosis not present

## 2017-12-05 DIAGNOSIS — M545 Low back pain: Secondary | ICD-10-CM | POA: Diagnosis not present

## 2017-12-05 DIAGNOSIS — M25552 Pain in left hip: Secondary | ICD-10-CM | POA: Diagnosis not present

## 2018-01-29 ENCOUNTER — Inpatient Hospital Stay (HOSPITAL_COMMUNITY)
Admission: AD | Admit: 2018-01-29 | Discharge: 2018-01-29 | Disposition: A | Discharge status: Home / Self Care | Payer: 59 | Source: Ambulatory Visit | Attending: Obstetrics & Gynecology | Admitting: Obstetrics & Gynecology

## 2018-01-29 ENCOUNTER — Encounter (HOSPITAL_COMMUNITY): Payer: Self-pay | Admitting: *Deleted

## 2018-01-29 ENCOUNTER — Other Ambulatory Visit: Payer: Self-pay

## 2018-01-29 DIAGNOSIS — Z3A01 Less than 8 weeks gestation of pregnancy: Secondary | ICD-10-CM

## 2018-01-29 DIAGNOSIS — O209 Hemorrhage in early pregnancy, unspecified: Secondary | ICD-10-CM | POA: Diagnosis present

## 2018-01-29 DIAGNOSIS — O200 Threatened abortion: Secondary | ICD-10-CM | POA: Diagnosis not present

## 2018-01-29 LAB — CBC
HEMATOCRIT: 35.7 % — AB (ref 36.0–46.0)
HEMOGLOBIN: 12.1 g/dL (ref 12.0–15.0)
MCH: 29.4 pg (ref 26.0–34.0)
MCHC: 33.9 g/dL (ref 30.0–36.0)
MCV: 86.9 fL (ref 78.0–100.0)
Platelets: 290 10*3/uL (ref 150–400)
RBC: 4.11 MIL/uL (ref 3.87–5.11)
RDW: 13.9 % (ref 11.5–15.5)
WBC: 6.9 10*3/uL (ref 4.0–10.5)

## 2018-01-29 LAB — URINALYSIS, ROUTINE W REFLEX MICROSCOPIC
BILIRUBIN URINE: NEGATIVE
Glucose, UA: NEGATIVE mg/dL
Ketones, ur: NEGATIVE mg/dL
Leukocytes, UA: NEGATIVE
NITRITE: NEGATIVE
Protein, ur: NEGATIVE mg/dL
SPECIFIC GRAVITY, URINE: 1.01 (ref 1.005–1.030)
pH: 7 (ref 5.0–8.0)

## 2018-01-29 LAB — HCG, QUANTITATIVE, PREGNANCY: hCG, Beta Chain, Quant, S: 24 m[IU]/mL — ABNORMAL HIGH (ref <5)

## 2018-01-29 LAB — ABO/RH: ABO/RH(D): B POS

## 2018-01-29 LAB — POCT PREGNANCY, URINE: PREG TEST UR: NEGATIVE

## 2018-01-29 MED ORDER — ACETAMINOPHEN 500 MG PO TABS
1000.0000 mg | ORAL_TABLET | Freq: Once | ORAL | Status: AC
  Administered 2018-01-29: 1000 mg via ORAL
  Filled 2018-01-29: qty 2

## 2018-01-29 NOTE — MAU Provider Note (Signed)
Chief Complaint: Vaginal Bleeding and Possible Pregnancy   First Provider Initiated Contact with Patient 01/29/18 1401     SUBJECTIVE HPI: Amanda Daniels is a 32 y.o. G1P0000 at [redacted]w[redacted]d who presents to [facility] reporting vaginal bleeding and positive pregnancy test. Has had bleeding off and on for the last week. When the bleeding started she was 1 week late for her period. Is not using any contraception. Had light bleeding for 4 days that stopped on Thursday. Bleeding started again yesterday and has been heavy. Is having to change her pad every 2 hours. Not passing clots but is passing stuff that looks like gray tissue. Abdominal cramping since yesterday. Had positive pregnancy test this morning.  Location: lower abdomen Quality: cramping Severity: 5/10 on pain scale Duration: 1 day Timing: intermittent Modifying factors: none, hasn't treated symptoms Associated signs and symptoms: vaginal bleeding  Past Medical History:  Diagnosis Date  . Anemia   . Asthma    last attack 10 yrs ago  . Endometriosis of ovary    back in 2007  . GERD (gastroesophageal reflux disease)    OB History  Gravida Para Term Preterm AB Living  1 0 0 0 0 0  SAB TAB Ectopic Multiple Live Births  0 0 0 0 0    # Outcome Date GA Lbr Len/2nd Weight Sex Delivery Anes PTL Lv  1 Current            Past Surgical History:  Procedure Laterality Date  . laparoscopic ovaries     exploratory  . SCIATIC NERVE EXPLORATION Left 05/13/2016   Procedure: Left Sciatic Nerve Tumor Resection;  Surgeon: Earnie Larsson, MD;  Location: [facility];  Service: Neurosurgery;  Laterality: Left;   Social History   Socioeconomic History  . Marital status: Single    Spouse name: Not on file  . Number of children: Not on file  . Years of education: Not on file  . Highest education level: Not on file  Occupational History  . Not on file  Social Needs  . Financial resource strain: Not on file  . Food insecurity:    Worry: Not on  file    Inability: Not on file  . Transportation needs:    Medical: Not on file    Non-medical: Not on file  Tobacco Use  . Smoking status: Never Smoker  . Smokeless tobacco: Never Used  Substance and Sexual Activity  . Alcohol use: Yes    Alcohol/week: 1.2 oz    Types: 2 Standard drinks or equivalent per week    Comment: one every 2 mths  . Drug use: No  . Sexual activity: Yes    Birth control/protection: None  Lifestyle  . Physical activity:    Days per week: Not on file    Minutes per session: Not on file  . Stress: Not on file  Relationships  . Social connections:    Talks on phone: Not on file    Gets together: Not on file    Attends religious service: Not on file    Active member of club or organization: Not on file    Attends meetings of clubs or organizations: Not on file    Relationship status: Not on file  . Intimate partner violence:    Fear of current or ex partner: Not on file    Emotionally abused: Not on file    Physically abused: Not on file    Forced sexual activity: Not on file  Other  Topics Concern  . Not on file  Social History Narrative  . Not on file   No family history on file. No current facility-administered medications on file prior to encounter.    Current Outpatient Medications on File Prior to Encounter  Medication Sig Dispense Refill  . COLLAGEN PO Take 1 tablet by mouth daily.    . Omega-3 Fatty Acids (FISH OIL) 1000 MG CAPS Take 1,000 mg by mouth daily.     Marland Kitchen omeprazole (PRILOSEC) 20 MG capsule Take 20 mg by mouth daily.    . TURMERIC PO Take 1 tablet by mouth daily.     Allergies  Allergen Reactions  . Crab [Shellfish Allergy] Other (See Comments)    Tongue swells.  . Latex Itching    inflamation  . Other Itching    Pecans    I have reviewed patient's Past Medical Hx, Surgical Hx, Family Hx, Social Hx, medications and allergies.   Review of Systems  Constitutional: Negative.   Gastrointestinal: Positive for abdominal  pain.  Genitourinary: Positive for vaginal bleeding.    OBJECTIVE Patient Vitals for the past 24 hrs:  BP Temp Pulse Resp Height Weight  01/29/18 1552 117/68 - - - - -  01/29/18 1325 129/67 98 F (36.7 C) (!) 51 18 5\' 5"  (1.651 m) 183 lb (83 kg)   Constitutional: Well-developed, well-nourished female in no acute distress.  Cardiovascular: normal rate & rhythm, no murmur Respiratory: normal rate and effort. Lung sounds clear throughout GI: Abd soft, non-tender, Pos BS x 4. No guarding or rebound tenderness MS: Extremities nontender, no edema, normal ROM Neurologic: Alert and oriented x 4.  GU:   SPECULUM EXAM: NEFG, small amount of dark red mucoid blood  BIMANUAL: cervix closed; uterus normal size, no adnexal tenderness or masses.  No CMT.  LAB RESULTS Results for orders placed or performed during the hospital encounter of 01/29/18 (from the past 24 hour(s))  Urinalysis, Routine w reflex microscopic     Status: Abnormal   Collection Time: 01/29/18  1:33 PM  Result Value Ref Range   Color, Urine YELLOW YELLOW   APPearance CLEAR CLEAR   Specific Gravity, Urine 1.010 1.005 - 1.030   pH 7.0 5.0 - 8.0   Glucose, UA NEGATIVE NEGATIVE mg/dL   Hgb urine dipstick LARGE (A) NEGATIVE   Bilirubin Urine NEGATIVE NEGATIVE   Ketones, ur NEGATIVE NEGATIVE mg/dL   Protein, ur NEGATIVE NEGATIVE mg/dL   Nitrite NEGATIVE NEGATIVE   Leukocytes, UA NEGATIVE NEGATIVE   RBC / HPF 0-5 0 - 5 RBC/hpf   WBC, UA 0-5 0 - 5 WBC/hpf   Bacteria, UA RARE (A) NONE SEEN   Squamous Epithelial / LPF 0-5 0 - 5  Pregnancy, urine POC     Status: None   Collection Time: 01/29/18  1:36 PM  Result Value Ref Range   Preg Test, Ur NEGATIVE NEGATIVE  CBC     Status: Abnormal   Collection Time: 01/29/18  2:28 PM  Result Value Ref Range   WBC 6.9 4.0 - 10.5 K/uL   RBC 4.11 3.87 - 5.11 MIL/uL   Hemoglobin 12.1 12.0 - 15.0 g/dL   HCT 35.7 (L) 36.0 - 46.0 %   MCV 86.9 78.0 - 100.0 fL   MCH 29.4 26.0 - 34.0 pg    MCHC 33.9 30.0 - 36.0 g/dL   RDW 13.9 11.5 - 15.5 %   Platelets 290 150 - 400 K/uL  hCG, quantitative, pregnancy     Status: Abnormal  Collection Time: 01/29/18  2:28 PM  Result Value Ref Range   hCG, Beta Chain, Quant, S 24 (H) <5 mIU/mL  ABO/Rh     Status: None (Preliminary result)   Collection Time: 01/29/18  2:28 PM  Result Value Ref Range   ABO/RH(D)      B POS Performed at [facility], [address] [place], [place] [address]     IMAGING No results found.  MAU COURSE Orders Placed This Encounter  Procedures  . Urinalysis, Routine w reflex microscopic  . CBC  . hCG, quantitative, pregnancy  . Pregnancy, urine POC  . ABO/Rh  . Discharge patient   Meds ordered this encounter  Medications  . acetaminophen (TYLENOL) tablet 1,000 mg    MDM Negative UPT CBC, HCG, abo ordered HCG 24 Blood type B positive Hemoglobin stable  C/w Dr. [name]. Pt to have f/u HCG in office on Wednesday  ASSESSMENT 1. Threatened miscarriage   2. Less than [redacted] weeks gestation of pregnancy     PLAN Discharge home in stable condition. Bleeding precautions Call office for f/u lab on Wednesday Pelvic rest  Follow-up Information    Gynecology, [place] Follow up.   Specialty:  Obstetrics and Gynecology Why:  Call office to schedule follow up lab visit on Wednesday Contact information: [address] [place] [address] [phone]          Allergies as of 01/29/2018      Reactions   Crab [shellfish Allergy] Other (See Comments)   Tongue swells.   Latex Itching   inflamation   Other Itching   Pecans      Medication List    TAKE these medications   COLLAGEN PO Take 1 tablet by mouth daily.   Fish Oil 1000 MG Caps Take 1,000 mg by mouth daily.   omeprazole 20 MG capsule Commonly known as:  PRILOSEC Take 20 mg by mouth daily.   TURMERIC PO Take 1 tablet by mouth daily.        Jorje Guild, NP 01/29/2018  7:58 PM

## 2018-01-29 NOTE — Discharge Instructions (Signed)
Return to care   If you have heavier bleeding that soaks through more that 2 pads per hour for an hour or more  If you bleed so much that you feel like you might pass out or you do pass out  If you have significant abdominal pain that is not improved with Tylenol   If you develop a fever > 100.5   Threatened Miscarriage A threatened miscarriage occurs when you have vaginal bleeding during your first 20 weeks of pregnancy but the pregnancy has not ended. If you have vaginal bleeding during this time, your health care provider will do tests to make sure you are still pregnant. If the tests show you are still pregnant and the developing baby (fetus) inside your womb (uterus) is still growing, your condition is considered a threatened miscarriage. A threatened miscarriage does not mean your pregnancy will end, but it does increase the risk of losing your pregnancy (complete miscarriage). What are the causes? The cause of a threatened miscarriage is usually not known. If you go on to have a complete miscarriage, the most common cause is an abnormal number of chromosomes in the developing baby. Chromosomes are the structures inside cells that hold all your genetic material. Some causes of vaginal bleeding that do not result in miscarriage include:  Having sex.  Having an infection.  Normal hormone changes of pregnancy.  Bleeding that occurs when an egg implants in your uterus.  What increases the risk? Risk factors for bleeding in early pregnancy include:  Obesity.  Smoking.  Drinking excessive amounts of alcohol or caffeine.  Recreational drug use.  What are the signs or symptoms?  Light vaginal bleeding.  Mild abdominal pain or cramps. How is this diagnosed? If you have bleeding with or without abdominal pain before 20 weeks of pregnancy, your health care provider will do tests to check whether you are still pregnant. One important test involves using sound waves and a computer  (ultrasound) to create images of the inside of your uterus. Other tests include an internal exam of your vagina and uterus (pelvic exam) and measurement of your babys heart rate. You may be diagnosed with a threatened miscarriage if:  Ultrasound testing shows you are still pregnant.  Your babys heart rate is strong.  A pelvic exam shows that the opening between your uterus and your vagina (cervix) is closed.  Your heart rate and blood pressure are stable.  Blood tests confirm you are still pregnant.  How is this treated? No treatments have been shown to prevent a threatened miscarriage from going on to a complete miscarriage. However, the right home care is important. Follow these instructions at home:  Make sure you keep all your appointments for prenatal care. This is very important.  Get plenty of rest.  Do not have sex or use tampons if you have vaginal bleeding.  Do not douche.  Do not smoke or use recreational drugs.  Do not drink alcohol.  Avoid caffeine. Contact a health care provider if:  You have light vaginal bleeding or spotting while pregnant.  You have abdominal pain or cramping.  You have a fever. Get help right away if:  You have heavy vaginal bleeding.  You have blood clots coming from your vagina.  You have severe low back pain or abdominal cramps.  You have fever, chills, and severe abdominal pain. This information is not intended to replace advice given to you by your health care provider. Make sure you discuss any questions  you have with your health care provider. Document Released: 06/27/2005 Document Revised: 12/03/2015 Document Reviewed: 04/23/2013 Elsevier Interactive Patient Education  [employer].

## 2018-01-29 NOTE — MAU Note (Signed)
Pt presents to MAU with complaints of vaginal bleeding for 7 days. States she took a pregnancy test this morning, she called her OBGYN office and was told to take a pregnancy test. Lower abdominal cramping.

## 2018-01-31 DIAGNOSIS — O039 Complete or unspecified spontaneous abortion without complication: Secondary | ICD-10-CM | POA: Diagnosis not present

## 2018-02-16 DIAGNOSIS — O034 Incomplete spontaneous abortion without complication: Secondary | ICD-10-CM | POA: Diagnosis not present

## 2018-03-14 DIAGNOSIS — M25511 Pain in right shoulder: Secondary | ICD-10-CM | POA: Diagnosis not present

## 2018-03-23 DIAGNOSIS — M25511 Pain in right shoulder: Secondary | ICD-10-CM | POA: Diagnosis not present

## 2018-03-28 DIAGNOSIS — Z3202 Encounter for pregnancy test, result negative: Secondary | ICD-10-CM | POA: Diagnosis not present

## 2018-03-28 DIAGNOSIS — N979 Female infertility, unspecified: Secondary | ICD-10-CM | POA: Diagnosis not present

## 2018-03-28 DIAGNOSIS — N808 Other endometriosis: Secondary | ICD-10-CM | POA: Diagnosis not present

## 2018-03-28 DIAGNOSIS — N809 Endometriosis, unspecified: Secondary | ICD-10-CM | POA: Diagnosis not present

## 2018-03-29 DIAGNOSIS — M25511 Pain in right shoulder: Secondary | ICD-10-CM | POA: Diagnosis not present

## 2018-04-06 DIAGNOSIS — M25511 Pain in right shoulder: Secondary | ICD-10-CM | POA: Diagnosis not present

## 2018-04-10 DIAGNOSIS — M25511 Pain in right shoulder: Secondary | ICD-10-CM | POA: Diagnosis not present

## 2018-04-18 DIAGNOSIS — M25511 Pain in right shoulder: Secondary | ICD-10-CM | POA: Diagnosis not present

## 2018-05-30 DIAGNOSIS — M25511 Pain in right shoulder: Secondary | ICD-10-CM | POA: Diagnosis not present

## 2018-06-25 DIAGNOSIS — N979 Female infertility, unspecified: Secondary | ICD-10-CM | POA: Diagnosis not present

## 2018-06-25 DIAGNOSIS — N939 Abnormal uterine and vaginal bleeding, unspecified: Secondary | ICD-10-CM | POA: Diagnosis not present

## 2018-06-25 DIAGNOSIS — N644 Mastodynia: Secondary | ICD-10-CM | POA: Diagnosis not present

## 2018-08-09 DIAGNOSIS — N979 Female infertility, unspecified: Secondary | ICD-10-CM | POA: Insufficient documentation

## 2018-08-09 DIAGNOSIS — N809 Endometriosis, unspecified: Secondary | ICD-10-CM | POA: Insufficient documentation

## 2018-08-10 DIAGNOSIS — N924 Excessive bleeding in the premenopausal period: Secondary | ICD-10-CM | POA: Diagnosis not present

## 2018-09-20 DIAGNOSIS — N8500 Endometrial hyperplasia, unspecified: Secondary | ICD-10-CM | POA: Diagnosis not present

## 2018-09-20 DIAGNOSIS — Z3202 Encounter for pregnancy test, result negative: Secondary | ICD-10-CM | POA: Diagnosis not present

## 2018-09-20 DIAGNOSIS — N924 Excessive bleeding in the premenopausal period: Secondary | ICD-10-CM | POA: Diagnosis not present

## 2018-09-20 DIAGNOSIS — N920 Excessive and frequent menstruation with regular cycle: Secondary | ICD-10-CM | POA: Diagnosis not present

## 2018-09-20 DIAGNOSIS — N840 Polyp of corpus uteri: Secondary | ICD-10-CM | POA: Diagnosis not present

## 2018-10-01 DIAGNOSIS — L308 Other specified dermatitis: Secondary | ICD-10-CM | POA: Diagnosis not present

## 2018-12-17 ENCOUNTER — Encounter (HOSPITAL_COMMUNITY): Payer: Self-pay

## 2019-02-26 IMAGING — DX DG HAND COMPLETE 3+V*R*
3 series · 3 of 3 positions shown · non-contrast
Comparison: None.

CLINICAL DATA: Pain following crush type injury

EXAM:
RIGHT HAND - COMPLETE 3+ VIEW

[hand pa]
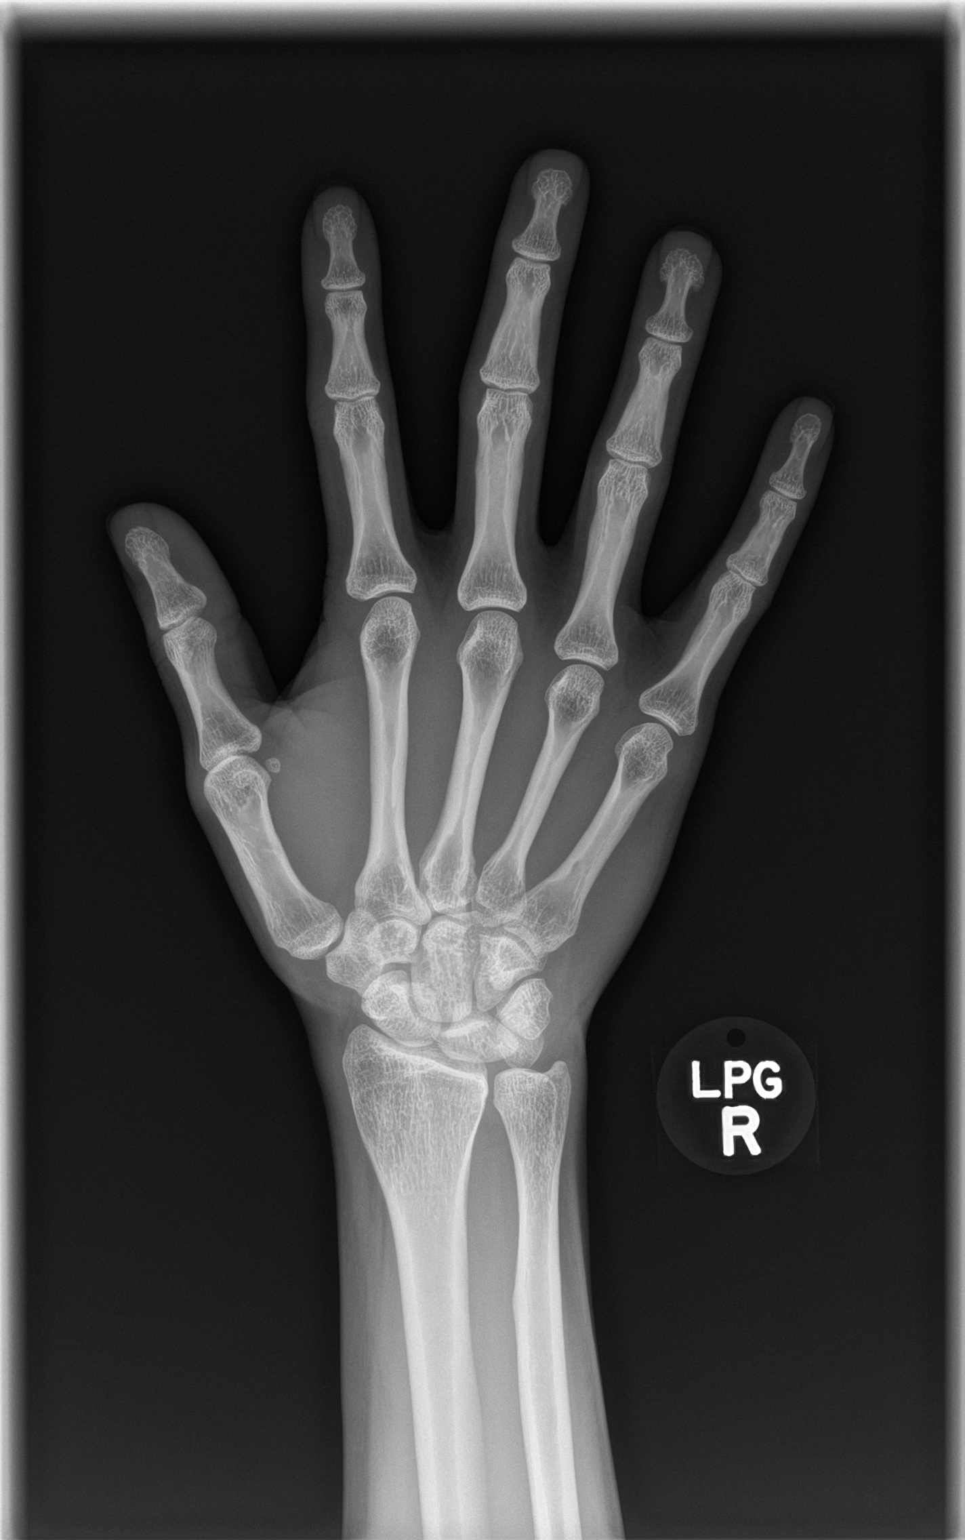

[hand obl]
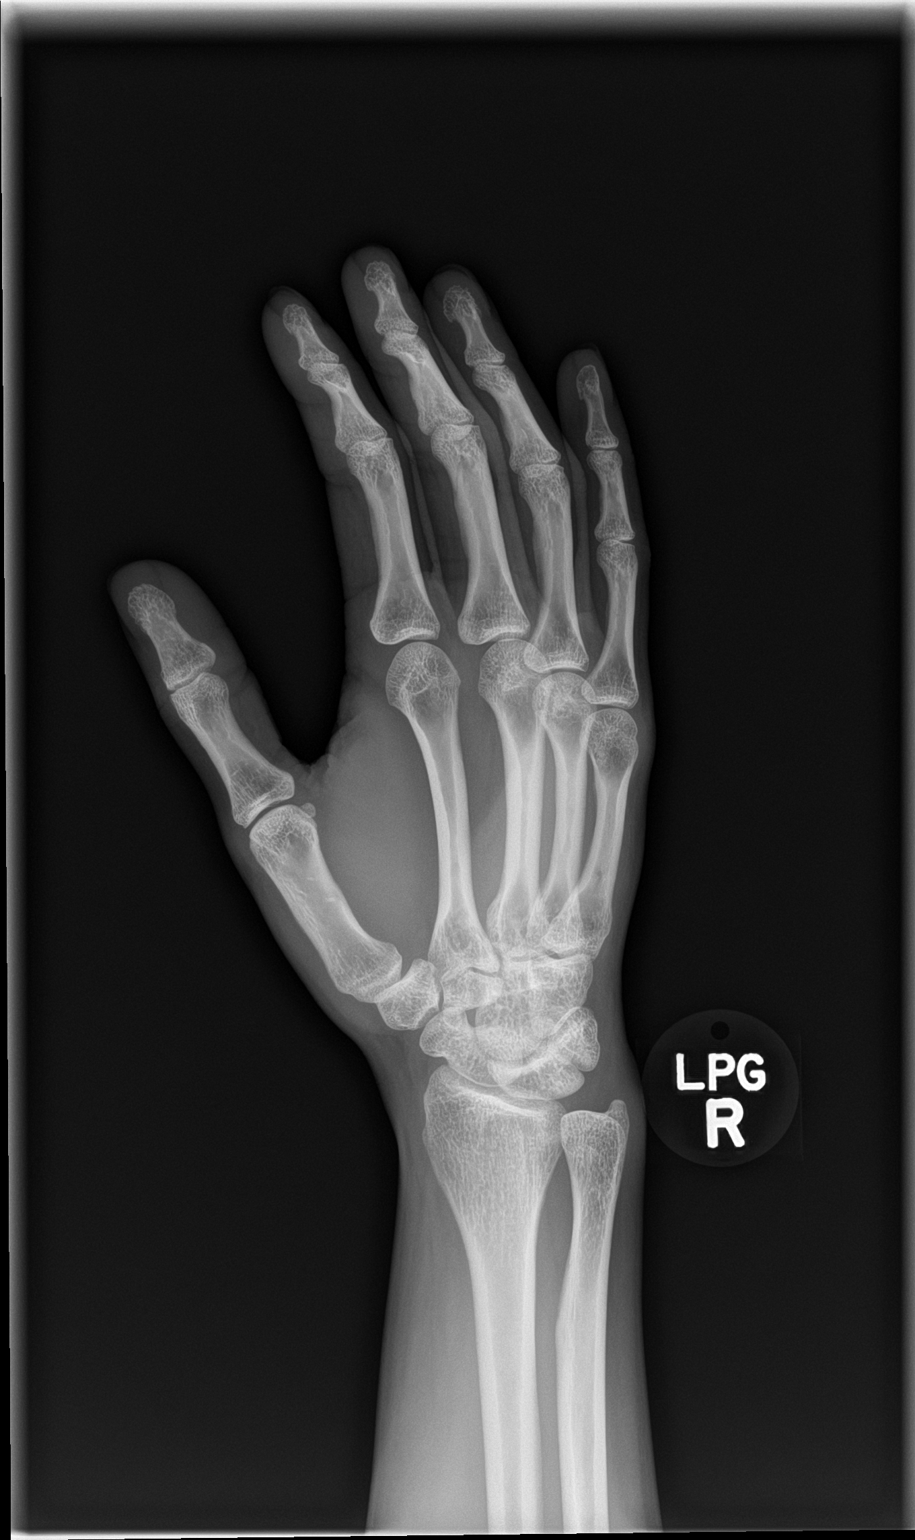

[hand lat]
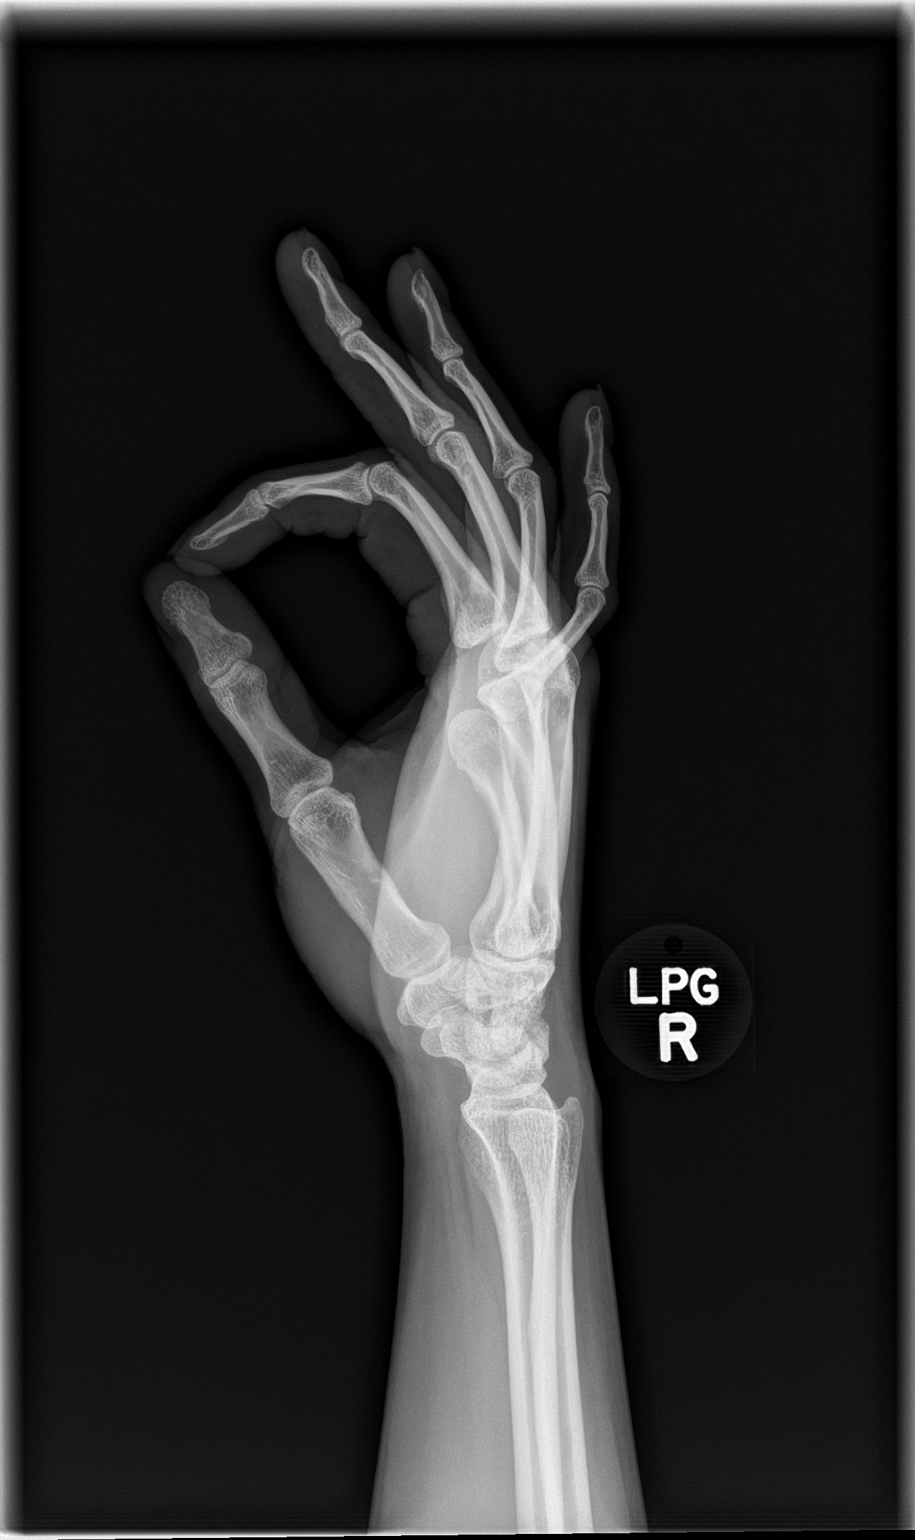

[3 of 3 positions shown; findings below may reference images not displayed]

FINDINGS: Frontal, oblique, and lateral views were obtained. There is no
evident fracture or dislocation. Joint spaces appear normal. No
erosive change.
IMPRESSION: No fracture or dislocation.  No evident arthropathy.

## 2019-08-27 DIAGNOSIS — N96 Recurrent pregnancy loss: Secondary | ICD-10-CM | POA: Insufficient documentation

## 2020-02-24 ENCOUNTER — Encounter: Payer: 59 | Attending: Obstetrics and Gynecology | Admitting: Dietician

## 2020-02-24 ENCOUNTER — Other Ambulatory Visit: Payer: Self-pay

## 2020-02-24 ENCOUNTER — Encounter: Payer: Self-pay | Admitting: Dietician

## 2020-02-24 DIAGNOSIS — E669 Obesity, unspecified: Secondary | ICD-10-CM | POA: Insufficient documentation

## 2020-02-24 NOTE — Progress Notes (Signed)
Medical Nutrition Therapy:  Appt start time: 1030 end time:  1140.   Assessment:  Primary concerns today: Weight gain.   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 Pt is looking to get her masters and bachelors in emergency management administration. Pt reports being [redacted] weeks pregnant.  Pt reports eating more frequently since becoming pregnant. Pt was referred by OB before pregnancy. 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 Pt reports gaining weight after the first miscarraige and it has not stopped since then. Pt reports not being able to figure out why she has continually gained weight. Pt reports that at 2:30-3:00 AM strong hunger wakes her up, and that she eats and is able to go back to sleep. Pt reports having reaction to shellfish and pecans in the past, but is not sure if they were the cause of her reaction. Pt reports not having reactions to these foods the last times she tried them, but still avoids them.  Pt reports body building /weigh training in the past, and currently exercises regularly. Pt reports a recent day of macros as follows: 60-75g of fat, 130g protein, and carbs 139 on off days. Pt also reports eating 33% of each macro (carbs, fats, protein) in the past when she would work out really hard. Pt reports that this dietary pattern worked for her then, but is not sustainable now.  Pt reports seeing past macro counting as a barrier to moving towards intuitive eating. Pt reports counting macros as stressful. Pt reports feeling that she did not eat enough before while exercising hard. Pt reports wanting to move away from counting macros, and follow a more intuitve eating style. Pt uses [employer] to count macros. MyFitnessPal to count calories.   Preferred Learning Style:   Auditory  Visual   Learning Readiness:   Ready  MEDICATIONS: Progesterone, Prenatal MV   DIETARY INTAKE:  Usual eating pattern includes 3 meals and 2 snacks per  day.  Everyday foods include Oatmeal with protein nut butter.  Avoided foods include Shellfish, pecans (suspected allergy).    24-hr recall:  B ( AM): Oatmeal with nut butter, can sugar  Snk ( AM): Popcorn  L ( PM): none (does not usually miss lunch) Snk ( PM): watermelon and a couple bites of protein  D ( PM): Salmon, jasmine rice, collard greens Snk (2:30 AM): Bran muffin Beverages: water  Usual physical activity: Stationary bike 30-45 mins, 5 days a week. 15-20 min resistance exercise. Street parking home cross fit workout, walks dogs about 2 miles a day.    Progress Towards Goal(s):  In progress.   Nutritional Diagnosis:  NB-1.1 Food and nutrition-related knowledge deficit As related to previous dietary habits.  As evidenced by self reported macronutrient counting, restricitve dietary practices, and calorie counting..    Intervention:  Nutrition Education.  Educate pt on the balanced plate eating model, and focusing on working towards eating balanced meals of starches, lean proteins, and non-starchy vegetable. Educate pt on intuitive eating to meet the proportions of the healthy plate model. Work with pt to [employer] ideas that fit the plate model. Educate pt on fostering and strengthening the relationship between their eyes, brain, and stomach. Counsel pt on the stresses of counting macronutrients, and educate pt on the impact of macro counting on their relationship with food. Discuss different ways to view the amounts of foods required for the pt to be satisfied when eating without counting macros. Highlight the importance  of the health of the mother and the baby during pregnancy, and ways to lower stress associated with food choices. Reaffirm pts ability to plan, prepare and follow dietary plan. This serves as proof of ability to be successful with dietitians recommendations. Recommend pt begin to phase out use of macro counting app as she incorporates more intuitive eating into  her lifestyle.    Goals:  Follow the healthy plate model. Focus on hitting the right proportions of starches, proteins, non-starchy vegetables.   Keep up the physical activity as advised by your doctor.   Listen to your body, eat when you are hungry!   Begin to phase out counting macros and use your app less!  Teaching Method Utilized:  Visual Auditory Hands on  Handouts given during visit include:  Balanced Plate  Balance Plate food group lists  Barriers to learning/adherence to lifestyle change: NONE  Demonstrated degree of understanding via:  Teach Back   Monitoring/Evaluation:  Dietary intake, exercise, and body weight in 1 month(s).

## 2020-02-24 NOTE — Patient Instructions (Addendum)
Follow the healthy plate model. Focus on hitting the right proportions of starches, proteins, non-starchy vegetables.  Keep up the physical activity as advised by your doctor.  Listen to your body, eat when you are hungry!  Begin to phase out counting macros and use your app less!

## 2020-03-05 LAB — OB RESULTS CONSOLE HIV ANTIBODY (ROUTINE TESTING): HIV: NONREACTIVE

## 2020-03-05 LAB — OB RESULTS CONSOLE HEPATITIS B SURFACE ANTIGEN: Hepatitis B Surface Ag: NEGATIVE

## 2020-03-05 LAB — OB RESULTS CONSOLE RUBELLA ANTIBODY, IGM: Rubella: IMMUNE

## 2020-03-05 LAB — OB RESULTS CONSOLE GC/CHLAMYDIA
Chlamydia: NEGATIVE
Gonorrhea: NEGATIVE

## 2020-03-05 LAB — OB RESULTS CONSOLE ABO/RH: RH Type: POSITIVE

## 2020-03-05 LAB — OB RESULTS CONSOLE RPR: RPR: NONREACTIVE

## 2020-03-05 LAB — OB RESULTS CONSOLE ANTIBODY SCREEN: Antibody Screen: NEGATIVE

## 2020-03-26 ENCOUNTER — Ambulatory Visit: Payer: 59 | Admitting: Dietician

## 2020-04-04 ENCOUNTER — Other Ambulatory Visit: Payer: Self-pay

## 2020-04-04 ENCOUNTER — Encounter (HOSPITAL_COMMUNITY): Payer: Self-pay | Admitting: Obstetrics and Gynecology

## 2020-04-04 ENCOUNTER — Inpatient Hospital Stay (HOSPITAL_COMMUNITY)
Admission: AD | Admit: 2020-04-04 | Discharge: 2020-04-04 | Disposition: A | Discharge status: Home / Self Care | Payer: 59 | Attending: Obstetrics and Gynecology | Admitting: Obstetrics and Gynecology

## 2020-04-04 DIAGNOSIS — O26891 Other specified pregnancy related conditions, first trimester: Secondary | ICD-10-CM | POA: Insufficient documentation

## 2020-04-04 DIAGNOSIS — Z3A12 12 weeks gestation of pregnancy: Secondary | ICD-10-CM | POA: Diagnosis not present

## 2020-04-04 DIAGNOSIS — Z79899 Other long term (current) drug therapy: Secondary | ICD-10-CM | POA: Diagnosis not present

## 2020-04-04 DIAGNOSIS — O99611 Diseases of the digestive system complicating pregnancy, first trimester: Secondary | ICD-10-CM | POA: Diagnosis not present

## 2020-04-04 DIAGNOSIS — O26899 Other specified pregnancy related conditions, unspecified trimester: Secondary | ICD-10-CM

## 2020-04-04 DIAGNOSIS — M7918 Myalgia, other site: Secondary | ICD-10-CM

## 2020-04-04 DIAGNOSIS — M791 Myalgia, unspecified site: Secondary | ICD-10-CM

## 2020-04-04 DIAGNOSIS — R109 Unspecified abdominal pain: Secondary | ICD-10-CM

## 2020-04-04 DIAGNOSIS — K219 Gastro-esophageal reflux disease without esophagitis: Secondary | ICD-10-CM | POA: Insufficient documentation

## 2020-04-04 LAB — URINALYSIS, ROUTINE W REFLEX MICROSCOPIC
Bilirubin Urine: NEGATIVE
Glucose, UA: NEGATIVE mg/dL
Hgb urine dipstick: NEGATIVE
Ketones, ur: NEGATIVE mg/dL
Leukocytes,Ua: NEGATIVE
Nitrite: NEGATIVE
Protein, ur: NEGATIVE mg/dL
Specific Gravity, Urine: 1.01 (ref 1.005–1.030)
pH: 6 (ref 5.0–8.0)

## 2020-04-04 NOTE — Discharge Instructions (Signed)

## 2020-04-04 NOTE — MAU Note (Signed)
Amanda Daniels is a 34 y.o. at [redacted]w[redacted]d here in MAU reporting: cramping since yesterday. States it started after a workout. States it is persistent, has not tried any pain medication at home. No bleeding, no abnormal discharge. Had an u/s on Tuesday and everything was fine.   Onset of complaint: yesterday  Pain score: 2/10  Vitals:   04/04/20 1340  BP: 124/66  Pulse: 84  Resp: 16  Temp: 97.8 F (36.6 C)  SpO2: 100%     Lab orders placed from triage: UA

## 2020-04-04 NOTE — MAU Provider Note (Signed)
History     CSN: [id number]  Arrival date and time: 04/04/20 1315   First Provider Initiated Contact with Patient 04/04/20 1409      Chief Complaint  Patient presents with  . Abdominal Pain   HPI Amanda Daniels is a 34 y.o. G4P0020 at [redacted]w[redacted]d who presents with abdominal pain. She states she did a workout that was of her normal abilities yesterday and started having cramping after. She states now the pain "isn't bad but noticeable." She denies any leaking, bleeding or abnormal discharge.   OB History    Gravida  4   Para  0   Term  0   Preterm  0   AB  2   Living  0     SAB  2   TAB  0   Ectopic  0   Multiple  0   Live Births  0           Past Medical History:  Diagnosis Date  . Anemia   . Asthma    last attack 10 yrs ago  . Endometriosis of ovary    back in 2007  . GERD (gastroesophageal reflux disease)     Past Surgical History:  Procedure Laterality Date  . laparoscopic ovaries     exploratory  . 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 No pertinent family history.  Social History   Tobacco Use  . Smoking status: Never Smoker  . Smokeless tobacco: Never Used  Substance Use Topics  . Alcohol use: Not Currently    Alcohol/week: 2.0 standard drinks    Types: 2 Standard drinks or equivalent per week    Comment: one every 2 mths  . Drug use: No    Allergies:  Allergies  Allergen Reactions  . Crab [Shellfish Allergy] Other (See Comments)    Tongue swells.  . Latex Itching    inflamation  . Other Itching    Pecans    Medications Prior to Admission  Medication Sig Dispense Refill Last Dose  . COLLAGEN PO Take 1 tablet by mouth daily. (Patient not taking: Reported on 02/24/2020)     . Omega-3 Fatty Acids (FISH OIL) 1000 MG CAPS Take 1,000 mg by mouth daily.      Marland Kitchen omeprazole  (PRILOSEC) 20 MG capsule Take 20 mg by mouth daily. (Patient not taking: Reported on 02/24/2020)     . progesterone (ENDOMETRIN) 100 MG vaginal insert Place 100 mg vaginally 2 (two) times daily.     . TURMERIC PO Take 1 tablet by mouth daily. (Patient not taking: Reported on 02/24/2020)       Review of Systems  Constitutional: Negative.  Negative for fatigue and fever.  HENT: Negative.   Respiratory: Negative.  Negative for shortness of breath.   Cardiovascular: Negative.  Negative for chest pain.  Gastrointestinal: Positive for abdominal pain. Negative for constipation, diarrhea, nausea and vomiting.  Genitourinary: Negative.  Negative for dysuria, vaginal bleeding and vaginal discharge.  Neurological: Negative.  Negative for dizziness and headaches.   Physical Exam   Blood pressure 124/66, pulse 84, temperature 97.8 F (36.6 C), temperature source Oral, resp. rate 16, height 5\' 5"  (1.651 m), weight 100.2 kg, SpO2 100 %, unknown if currently breastfeeding.  Physical Exam Vitals and nursing note reviewed.  Constitutional:      General: She is  not in acute distress.    Appearance: She is well-developed.  HENT:     Head: Normocephalic.  Eyes:     Pupils: Pupils are equal, round, and reactive to light.  Cardiovascular:     Rate and Rhythm: Normal rate and regular rhythm.     Heart sounds: Normal heart sounds.  Pulmonary:     Effort: Pulmonary effort is normal. No respiratory distress.     Breath sounds: Normal breath sounds.  Abdominal:     General: Bowel sounds are normal. There is no distension.     Palpations: Abdomen is soft.     Tenderness: There is no abdominal tenderness.  Genitourinary:    Comments: Cervix: closed/thick/posterior Skin:    General: Skin is warm and dry.  Neurological:     Mental Status: She is alert and oriented to person, place, and time.  Psychiatric:        Behavior: Behavior normal.        Thought Content: Thought content normal.        Judgment:  Judgment normal.     MAU Course  Procedures Results for orders placed or performed during the hospital encounter of 04/04/20 (from the past 24 hour(s))  Urinalysis, Routine w reflex microscopic Urine, Clean Catch     Status: Abnormal   Collection Time: 04/04/20  2:01 PM  Result Value Ref Range   Color, Urine YELLOW YELLOW   APPearance HAZY (A) CLEAR   Specific Gravity, Urine 1.010 1.005 - 1.030   pH 6.0 5.0 - 8.0   Glucose, UA NEGATIVE NEGATIVE mg/dL   Hgb urine dipstick NEGATIVE NEGATIVE   Bilirubin Urine NEGATIVE NEGATIVE   Ketones, ur NEGATIVE NEGATIVE mg/dL   Protein, ur NEGATIVE NEGATIVE mg/dL   Nitrite NEGATIVE NEGATIVE   Leukocytes,Ua NEGATIVE NEGATIVE   MDM UA FHT: 147 bpm  Patient reassured of normalcy of occasional cramping in pregnancy. Encouraged patient to continue exercising but modify to what feels more comfortable as the pregnancy progresses.  Assessment and Plan   1. Abdominal pain affecting pregnancy   2. Musculoskeletal pain   3. [redacted] weeks gestation of pregnancy    -Discharge home in stable condition -Abdominal pain precautions discussed -Patient advised to follow-up with OB as scheduled for prenatal care -Patient may return to MAU as needed or if her condition were to change or worsen   Wende Mott CNM 04/04/2020, 2:09 PM

## 2020-06-25 ENCOUNTER — Other Ambulatory Visit: Payer: Self-pay

## 2020-06-25 ENCOUNTER — Encounter (HOSPITAL_COMMUNITY): Payer: Self-pay | Admitting: Obstetrics and Gynecology

## 2020-06-25 ENCOUNTER — Inpatient Hospital Stay (HOSPITAL_COMMUNITY)
Admission: AD | Admit: 2020-06-25 | Discharge: 2020-06-25 | Disposition: A | Discharge status: Home / Self Care | Payer: 59 | Attending: Obstetrics and Gynecology | Admitting: Obstetrics and Gynecology

## 2020-06-25 DIAGNOSIS — N858 Other specified noninflammatory disorders of uterus: Secondary | ICD-10-CM

## 2020-06-25 DIAGNOSIS — Z3A24 24 weeks gestation of pregnancy: Secondary | ICD-10-CM

## 2020-06-25 DIAGNOSIS — O99891 Other specified diseases and conditions complicating pregnancy: Secondary | ICD-10-CM

## 2020-06-25 DIAGNOSIS — K219 Gastro-esophageal reflux disease without esophagitis: Secondary | ICD-10-CM

## 2020-06-25 DIAGNOSIS — Z79899 Other long term (current) drug therapy: Secondary | ICD-10-CM | POA: Insufficient documentation

## 2020-06-25 DIAGNOSIS — O99612 Diseases of the digestive system complicating pregnancy, second trimester: Secondary | ICD-10-CM

## 2020-06-25 LAB — URINALYSIS, ROUTINE W REFLEX MICROSCOPIC
Bilirubin Urine: NEGATIVE
Glucose, UA: NEGATIVE mg/dL
Hgb urine dipstick: NEGATIVE
Ketones, ur: NEGATIVE mg/dL
Leukocytes,Ua: NEGATIVE
Nitrite: NEGATIVE
Protein, ur: NEGATIVE mg/dL
Specific Gravity, Urine: 1.006 (ref 1.005–1.030)
pH: 6 (ref 5.0–8.0)

## 2020-06-25 LAB — FETAL FIBRONECTIN: Fetal Fibronectin: NEGATIVE

## 2020-06-25 MED ORDER — PANTOPRAZOLE SODIUM 20 MG PO TBEC: 20.0000 mg | DELAYED_RELEASE_TABLET | Freq: Every day | ORAL | 0 refills | Status: DC

## 2020-06-25 MED ORDER — ALUM & MAG HYDROXIDE-SIMETH 200-200-20 MG/5ML PO SUSP
30.0000 mL | Freq: Once | ORAL | Status: AC
  Administered 2020-06-25: 30 mL via ORAL
  Filled 2020-06-25: qty 30

## 2020-06-25 MED ORDER — NIFEDIPINE 10 MG PO CAPS
10.0000 mg | ORAL_CAPSULE | ORAL | Status: DC | PRN
  Administered 2020-06-25: 10 mg via ORAL
  Filled 2020-06-25: qty 1

## 2020-06-25 MED ORDER — LIDOCAINE VISCOUS HCL 2 % MT SOLN
15.0000 mL | Freq: Once | OROMUCOSAL | Status: AC
  Administered 2020-06-25: 15 mL via ORAL
  Filled 2020-06-25: qty 15

## 2020-06-25 NOTE — MAU Note (Signed)
Patient reports to triage with upper and lower abdominal pain. Patient reports upper abdominal pain that is dull, aching, and constant. Patient reports lower abdominal pain as intermittent cramping. Patient denies leaking of fluid or bleeding. Patient reports positive fetal movements.

## 2020-06-25 NOTE — Discharge Instructions (Signed)
Gastroesophageal Reflux Disease, Adult Gastroesophageal reflux (GER) happens when acid from the stomach flows up into the tube that connects the mouth and the stomach (esophagus). Normally, food travels down the esophagus and stays in the stomach to be digested. With GER, food and stomach acid sometimes move back up into the esophagus. You may have a disease called gastroesophageal reflux disease (GERD) if the reflux:  Happens often.  Causes frequent or very bad symptoms.  Causes problems such as damage to the esophagus. When this happens, the esophagus becomes sore and swollen (inflamed). Over time, GERD can make small holes (ulcers) in the lining of the esophagus. What are the causes? This condition is caused by a problem with the muscle between the esophagus and the stomach. When this muscle is weak or not normal, it does not close properly to keep food and acid from coming back up from the stomach. The muscle can be weak because of:  Tobacco use.  Pregnancy.  Having a certain type of hernia (hiatal hernia).  Alcohol use.  Certain foods and drinks, such as coffee, chocolate, onions, and peppermint. What increases the risk? You are more likely to develop this condition if you:  Are overweight.  Have a disease that affects your connective tissue.  Use NSAID medicines. What are the signs or symptoms? Symptoms of this condition include:  Heartburn.  Difficult or painful swallowing.  The feeling of having a lump in the throat.  A bitter taste in the mouth.  Bad breath.  Having a lot of saliva.  Having an upset or bloated stomach.  Belching.  Chest pain. Different conditions can cause chest pain. Make sure you see your doctor if you have chest pain.  Shortness of breath or noisy breathing (wheezing).  Ongoing (chronic) cough or a cough at night.  Wearing away of the surface of teeth (tooth enamel).  Weight loss. How is this treated? Treatment will depend on how  bad your symptoms are. Your doctor may suggest:  Changes to your diet.  Medicine.  Surgery. Follow these instructions at home: Eating and drinking   Follow a diet as told by your doctor. You may need to avoid foods and drinks such as: ? Coffee and tea (with or without caffeine). ? Drinks that contain alcohol. ? Energy drinks and sports drinks. ? Bubbly (carbonated) drinks or sodas. ? Chocolate and cocoa. ? Peppermint and mint flavorings. ? Garlic and onions. ? Horseradish. ? Spicy and acidic foods. These include peppers, chili powder, curry powder, vinegar, hot sauces, and BBQ sauce. ? Citrus fruit juices and citrus fruits, such as oranges, lemons, and limes. ? Tomato-based foods. These include red sauce, chili, salsa, and pizza with red sauce. ? Fried and fatty foods. These include donuts, french fries, potato chips, and high-fat dressings. ? High-fat meats. These include hot dogs, rib eye steak, sausage, ham, and bacon. ? High-fat dairy items, such as whole milk, butter, and cream cheese.  Eat small meals often. Avoid eating large meals.  Avoid drinking large amounts of liquid with your meals.  Avoid eating meals during the 2-3 hours before bedtime.  Avoid lying down right after you eat.  Do not exercise right after you eat. Lifestyle   Do not use any products that contain nicotine or tobacco. These include cigarettes, e-cigarettes, and chewing tobacco. If you need help quitting, ask your doctor.  Try to lower your stress. If you need help doing this, ask your doctor.  If you are overweight, lose an amount   of weight that is healthy for you. Ask your doctor about a safe weight loss goal. General instructions  Pay attention to any changes in your symptoms.  Take over-the-counter and prescription medicines only as told by your doctor. Do not take aspirin, ibuprofen, or other NSAIDs unless your doctor says it is okay.  Wear loose clothes. Do not wear anything tight  around your waist.  Raise (elevate) the head of your bed about 6 inches (15 cm).  Avoid bending over if this makes your symptoms worse.  Keep all follow-up visits as told by your doctor. This is important. Contact a doctor if:  You have new symptoms.  You lose weight and you do not know why.  You have trouble swallowing or it hurts to swallow.  You have wheezing or a cough that keeps happening.  Your symptoms do not get better with treatment.  You have a hoarse voice. Get help right away if:  You have pain in your arms, neck, jaw, teeth, or back.  You feel sweaty, dizzy, or light-headed.  You have chest pain or shortness of breath.  You throw up (vomit) and your throw-up looks like blood or coffee grounds.  You pass out (faint).  Your poop (stool) is bloody or black.  You cannot swallow, drink, or eat. Summary  If a person has gastroesophageal reflux disease (GERD), food and stomach acid move back up into the esophagus and cause symptoms or problems such as damage to the esophagus.  Treatment will depend on how bad your symptoms are.  Follow a diet as told by your doctor.  Take all medicines only as told by your doctor. This information is not intended to replace advice given to you by your health care provider. Make sure you discuss any questions you have with your health care provider. Document Revised: 01/03/2018 Document Reviewed: 01/03/2018 Elsevier Patient Education  [place].  Preterm Labor and Birth Information Pregnancy normally lasts 39-41 weeks. Preterm labor is when labor starts early. It starts before you have been pregnant for 37 whole weeks. What are the risk factors for preterm labor? Preterm labor is more likely to occur in women who:  Have an infection while pregnant.  Have a cervix that is short.  Have gone into preterm labor before.  Have had surgery on their cervix.  Are younger than age 40.  Are older than age 76.  Are  African American.  Are pregnant with two or more babies.  Take street drugs while pregnant.  Smoke while pregnant.  Do not gain enough weight while pregnant.  Got pregnant right after another pregnancy. What are the symptoms of preterm labor? Symptoms of preterm labor include:  Cramps. The cramps may feel like the cramps some women get during their period. The cramps may happen with watery poop (diarrhea).  Pain in the belly (abdomen).  Pain in the lower back.  Regular contractions or tightening. It may feel like your belly is getting tighter.  Pressure in the lower belly that seems to get stronger.  More fluid (discharge) leaking from the vagina. The fluid may be watery or bloody.  Water breaking. Why is it important to notice signs of preterm labor? Babies who are born early may not be fully developed. They have a higher chance for:  Long-term heart problems.  Long-term lung problems.  Trouble controlling body systems, like breathing.  Bleeding in the brain.  A condition called cerebral palsy.  Learning difficulties.  Death. These risks are  highest for babies who are born before 33 weeks of pregnancy. How is preterm labor treated? Treatment depends on:  How long you were pregnant.  Your condition.  The health of your baby. Treatment may involve:  Having a stitch (suture) placed in your cervix. When you give birth, your cervix opens so the baby can come out. The stitch keeps the cervix from opening too soon.  Staying at the hospital.  Taking or getting medicines, such as: ? Hormone medicines. ? Medicines to stop contractions. ? Medicines to help the baby's lungs develop. ? Medicines to prevent your baby from having cerebral palsy. What should I do if I am in preterm labor? If you think you are going into labor too soon, call your doctor right away. How can I prevent preterm labor?  Do not use any tobacco products. ? Examples of these are  cigarettes, chewing tobacco, and e-cigarettes. ? If you need help quitting, ask your doctor.  Do not use street drugs.  Do not use any medicines unless you ask your doctor if they are safe for you.  Talk with your doctor before taking any herbal supplements.  Make sure you gain enough weight.  Watch for infection. If you think you might have an infection, get it checked right away.  If you have gone into preterm labor before, tell your doctor. This information is not intended to replace advice given to you by your health care provider. Make sure you discuss any questions you have with your health care provider. Document Revised: 10/19/2018 Document Reviewed: 11/18/2015 Elsevier Patient Education  [place].

## 2020-06-25 NOTE — MAU Note (Signed)
Having "annoying pain " in upper abd since 1600. Later started having period like cramps in lower abd. Cont to have both pains. Denies VB or vag d/c.

## 2020-06-25 NOTE — MAU Provider Note (Signed)
Chief Complaint:  Abdominal Pain   Event Date/Time   First Provider Initiated Contact with Patient 06/25/20 2103     HPI: Amanda Daniels is a 34 y.o. G4P0020 at 41w4dwho presents to maternity admissions reporting epigastric discomfort related to preexisting GERD and pelvic cramping "like menstrual cramps".  . She reports good fetal movement, denies LOF, vaginal bleeding, vaginal itching/burning, urinary symptoms, h/a, dizziness, n/v, diarrhea, constipation or fever/chills.    Abdominal Pain This is a new problem. The current episode started today. The onset quality is gradual. The problem occurs intermittently. The problem has been unchanged. The pain is located in the suprapubic region, LLQ and RLQ. The pain is mild. The quality of the pain is cramping. The abdominal pain does not radiate. Pertinent negatives include no constipation, diarrhea, dysuria, fever, frequency, headaches or myalgias. Nothing aggravates the pain. The pain is relieved by nothing. She has tried antacids for the symptoms. The treatment provided no relief.    RN Note: Having "annoying pain " in upper abd since 1600. Later started having period like cramps in lower abd. Cont to have both pains. Denies VB or vag d/c.  Past Medical History: Past Medical History:  Diagnosis Date  . Anemia   . Asthma    last attack 10 yrs ago  . Endometriosis of ovary    back in 2007  . GERD (gastroesophageal reflux disease)     Past obstetric history: OB History  Gravida Para Term Preterm AB Living  4 0 0 0 2 0  SAB IAB Ectopic Multiple Live Births  2 0 0 0 0    # Outcome Date GA Lbr Len/2nd Weight Sex Delivery Anes PTL Lv  4 Current           3 SAB           2 SAB           1 Gravida             Past Surgical History: Past Surgical History:  Procedure Laterality Date  . laparoscopic ovaries     exploratory  . SCIATIC NERVE EXPLORATION Left 05/13/2016   Procedure: Left Sciatic Nerve Tumor Resection;  Surgeon: Earnie Larsson,  MD;  Location: [facility];  Service: Neurosurgery;  Laterality: Left;    Family History: No family history on file.  Social History: Social History   Tobacco Use  . Smoking status: Never Smoker  . Smokeless tobacco: Never Used  Substance Use Topics  . Alcohol use: Not Currently    Alcohol/week: 2.0 standard drinks    Types: 2 Standard drinks or equivalent per week    Comment: one every 2 mths  . Drug use: No    Allergies:  Allergies  Allergen Reactions  . Crab [Shellfish Allergy] Other (See Comments)    Tongue swells.  . Latex Itching    inflamation  . Other Itching    Pecans    Meds:  Medications Prior to Admission  Medication Sig Dispense Refill Last Dose  . COLLAGEN PO Take 1 tablet by mouth daily. (Patient not taking: Reported on 02/24/2020)     . Omega-3 Fatty Acids (FISH OIL) 1000 MG CAPS Take 1,000 mg by mouth daily.      Marland Kitchen omeprazole (PRILOSEC) 20 MG capsule Take 20 mg by mouth daily. (Patient not taking: Reported on 02/24/2020)     . progesterone (ENDOMETRIN) 100 MG vaginal insert Place 100 mg vaginally 2 (two) times daily.     . TURMERIC  PO Take 1 tablet by mouth daily. (Patient not taking: Reported on 02/24/2020)       I have reviewed patient's Past Medical Hx, Surgical Hx, Family Hx, Social Hx, medications and allergies.   ROS:  Review of Systems  Constitutional: Negative for fever.  Gastrointestinal: Positive for abdominal pain. Negative for constipation and diarrhea.  Genitourinary: Negative for dysuria and frequency.  Musculoskeletal: Negative for myalgias.  Neurological: Negative for headaches.   Other systems negative  Physical Exam   Patient Vitals for the past 24 hrs:  BP Temp Pulse Resp Height Weight  06/25/20 2047 128/75 -- 92 -- -- --  06/25/20 2045 -- 97.8 F (36.6 C) -- 18 5\' 5"  (1.651 m) 114.8 kg   Constitutional: Well-developed, well-nourished female in no acute distress.  Cardiovascular: normal rate and rhythm Respiratory: normal  effort, clear to auscultation bilaterally GI: Abd soft, non-tender, gravid appropriate for gestational age.   No rebound or guarding. MS: Extremities nontender, no edema, normal ROM Neurologic: Alert and oriented x 4.  GU: Neg CVAT.  PELVIC EXAM:   Dilation: Closed Effacement (%): Thick  FHT:  Baseline 140 , moderate variability, accelerations present, no decelerations Contractions: uterine irritability (cramps 10-15 sec)   Labs: Results for orders placed or performed during the hospital encounter of 06/25/20 (from the past 24 hour(s))  Urinalysis, Routine w reflex microscopic     Status: Abnormal   Collection Time: 06/25/20  9:17 PM  Result Value Ref Range   Color, Urine STRAW (A) YELLOW   APPearance CLEAR CLEAR   Specific Gravity, Urine 1.006 1.005 - 1.030   pH 6.0 5.0 - 8.0   Glucose, UA NEGATIVE NEGATIVE mg/dL   Hgb urine dipstick NEGATIVE NEGATIVE   Bilirubin Urine NEGATIVE NEGATIVE   Ketones, ur NEGATIVE NEGATIVE mg/dL   Protein, ur NEGATIVE NEGATIVE mg/dL   Nitrite NEGATIVE NEGATIVE   Leukocytes,Ua NEGATIVE NEGATIVE  Fetal fibronectin     Status: None   Collection Time: 06/25/20  9:17 PM  Result Value Ref Range   Fetal Fibronectin NEGATIVE NEGATIVE      Imaging:  No results found.  MAU Course/MDM: I have ordered labs and reviewed results. Fetal Fibronectin is negative, reassured patient.   NST reviewed   Treatments in MAU included GI cocktail which relieved her pain in epigastrum. We gave her one dose of procardia which diminished her uterine cramping. .    Assessment: Single IUP at [redacted]w[redacted]d Uterine irritability GERD  Plan: Discharge home Preterm Labor precautions and fetal kick counts Rx Protonix for prn use for GERD Follow up in Office for prenatal visits   Encouraged to return if she develops worsening of symptoms, increase in pain, fever, or other concerning symptoms.   Pt stable at time of discharge.  Hansel Feinstein CNM, MSN Certified  Nurse-Midwife 06/25/2020 9:05 PM

## 2020-07-11 NOTE — L&D Delivery Note (Signed)
Operative Delivery Note At 1:30 PM a viable female was delivered via Vaginal, Vacuum Neurosurgeon).  Presentation: vertex; Position: Right,, Occiput,, Anterior; Station: +3.  Patient has been pushing x 4 hours and while gradual descent has occurred, patient expresses exhaustion.  She is offered Kiwi vacuum and accepts.  Verbal consent: obtained from patient.  Risks and benefits discussed in detail.  Risks include, but are not limited to the risks of anesthesia, bleeding, infection, damage to maternal tissues, fetal cephalhematoma, and rarely brain bleed.  There is also the risk of inability to effect vaginal delivery of the head, or shoulder dystocia that cannot be resolved by established maneuvers, leading to the need for emergency cesarean section.  APGAR: 8, 9; weight pending.   Placenta status: S, I.   Cord: 3V with the following complications: none.  Cord pH: n/a  Atony was identified following delivery. 0.2 mg IM Methergine, 1 g IV TXA, 1000 mcg rectal cytotec and 250 mcg IM hemabate/Lomotil were given sequentially.  Following medication administration and aggressive fundal massage, tone was adequate and bleeding scant.    Anesthesia:  CLEA Instruments: Kiwi (1 pop off); pulled 3 times with one contraction to affect delivery Episiotomy: None Lacerations: 2nd degree Suture Repair: 3.0 vicryl rapide Est. Blood Loss (mL): 1100  Mom to postpartum.  Baby to Couplet care / Skin to Skin.  Linda Hedges 10/09/2020, 2:17 PM

## 2020-07-18 ENCOUNTER — Other Ambulatory Visit: Payer: Self-pay | Admitting: Advanced Practice Midwife

## 2020-07-27 ENCOUNTER — Encounter (HOSPITAL_COMMUNITY): Payer: Self-pay | Admitting: Obstetrics and Gynecology

## 2020-07-27 ENCOUNTER — Inpatient Hospital Stay (HOSPITAL_COMMUNITY)
Admission: AD | Admit: 2020-07-27 | Discharge: 2020-07-27 | Disposition: A | Discharge status: Home / Self Care | Payer: 59 | Attending: Obstetrics and Gynecology | Admitting: Obstetrics and Gynecology

## 2020-07-27 ENCOUNTER — Other Ambulatory Visit: Payer: Self-pay

## 2020-07-27 DIAGNOSIS — Z3A29 29 weeks gestation of pregnancy: Secondary | ICD-10-CM | POA: Insufficient documentation

## 2020-07-27 DIAGNOSIS — O99891 Other specified diseases and conditions complicating pregnancy: Secondary | ICD-10-CM | POA: Diagnosis not present

## 2020-07-27 DIAGNOSIS — O26893 Other specified pregnancy related conditions, third trimester: Secondary | ICD-10-CM | POA: Insufficient documentation

## 2020-07-27 DIAGNOSIS — R109 Unspecified abdominal pain: Secondary | ICD-10-CM | POA: Diagnosis present

## 2020-07-27 DIAGNOSIS — Z3689 Encounter for other specified antenatal screening: Secondary | ICD-10-CM | POA: Diagnosis not present

## 2020-07-27 DIAGNOSIS — N858 Other specified noninflammatory disorders of uterus: Secondary | ICD-10-CM | POA: Diagnosis not present

## 2020-07-27 LAB — URINALYSIS, ROUTINE W REFLEX MICROSCOPIC
Bilirubin Urine: NEGATIVE
Glucose, UA: NEGATIVE mg/dL
Hgb urine dipstick: NEGATIVE
Ketones, ur: NEGATIVE mg/dL
Leukocytes,Ua: NEGATIVE
Nitrite: NEGATIVE
Protein, ur: NEGATIVE mg/dL
Specific Gravity, Urine: 1.006 (ref 1.005–1.030)
pH: 7 (ref 5.0–8.0)

## 2020-07-27 MED ORDER — NIFEDIPINE 10 MG PO CAPS
10.0000 mg | ORAL_CAPSULE | ORAL | Status: AC | PRN
  Administered 2020-07-27 (×3): 10 mg via ORAL
  Filled 2020-07-27 (×3): qty 1

## 2020-07-27 NOTE — MAU Note (Addendum)
Had cramping all day yesterday and continues this AM. I thought I should come in and have it checked out. Not getting worse but not going away. Denies VB or LOF. Reports good FM. Stomach feels hard at times

## 2020-07-27 NOTE — MAU Provider Note (Signed)
History     CSN: [id number]  Arrival date and time: 07/27/20 0225   Event Date/Time   First Provider Initiated Contact with Patient 07/27/20 0259      Chief Complaint  Patient presents with  . Abdominal Pain   Amanda Daniels is a 35 y.o. G4P0 at [redacted]w[redacted]d who presents to MAU with complaints of abdominal pain. Patient reports lower abdominal cramping that has been occurring intermittently over 24 hours. Patient reports cramping started yesterday and continued throughout day til this morning when she decided to come into MAU. Patient reports having tightening across her whole belly that occurred yesterday but went away. She reports that she is not dehydrated because "I drink water all day" but did not try increasing water consumption to get rid of cramping. Patient reports that she tried resting then tried to get up and move around which neither helped with cramping. She denies vaginal bleeding, LOF or discharge. Patient reports + FM.     OB History    Gravida  4   Para  0   Term  0   Preterm  0   AB  3   Living  0     SAB  3   IAB  0   Ectopic  0   Multiple  0   Live Births  0           Past Medical History:  Diagnosis Date  . Anemia   . Asthma    last attack 10 yrs ago  . Endometriosis of ovary    back in 2007  . GERD (gastroesophageal reflux disease)     Past Surgical History:  Procedure Laterality Date  . laparoscopic ovaries     exploratory  . SCIATIC NERVE EXPLORATION Left 05/13/2016   Procedure: Left Sciatic Nerve Tumor Resection;  Surgeon: Julio Sicks, MD;  Location: [facility] OR;  Service: Neurosurgery;  Laterality: Left;    Family History  Problem Relation Age of Onset  . Diabetes Maternal Grandmother   . Hypertension Maternal Grandmother   . Diabetes Maternal Grandfather   . Hypertension Maternal Grandfather   . Diabetes Paternal Grandmother   . Hypertension Paternal Grandmother   . Diabetes Paternal Grandfather   . Hypertension Paternal  Grandfather     Social History   Tobacco Use  . Smoking status: Never Smoker  . Smokeless tobacco: Never Used  Vaping Use  . Vaping Use: Never used  Substance Use Topics  . Alcohol use: Not Currently    Alcohol/week: 2.0 standard drinks    Types: 2 Standard drinks or equivalent per week    Comment: one every 2 mths  . Drug use: No    Allergies:  Allergies  Allergen Reactions  . Latex Itching    inflamation    Medications Prior to Admission  Medication Sig Dispense Refill Last Dose  . Omega-3 Fatty Acids (FISH OIL) 1000 MG CAPS Take 1,000 mg by mouth daily.    07/26/2020 at Unknown time  . pantoprazole (PROTONIX) 20 MG tablet Take 1 tablet (20 mg total) by mouth daily. 30 tablet 0 07/26/2020 at Unknown time  . Prenatal Vit-Fe Fumarate-FA (PREPLUS) 27-1 MG TABS Take 1 tablet by mouth daily.   07/26/2020 at Unknown time  . COLLAGEN PO Take 1 tablet by mouth daily. (Patient not taking: No sig reported)   Unknown at Unknown time  . omeprazole (PRILOSEC) 20 MG capsule Take 20 mg by mouth daily. (Patient not taking: No sig reported)  Unknown at Unknown time  . progesterone (ENDOMETRIN) 100 MG vaginal insert Place 100 mg vaginally 2 (two) times daily.   Unknown at Unknown time  . TURMERIC PO Take 1 tablet by mouth daily. (Patient not taking: No sig reported)   Unknown at Unknown time    Review of Systems  Constitutional: Negative.   Respiratory: Negative.   Cardiovascular: Negative.   Gastrointestinal: Positive for abdominal pain. Negative for constipation, diarrhea, nausea and vomiting.       Uterine cramping   Genitourinary: Negative.   Musculoskeletal: Negative.   Neurological: Negative.   Psychiatric/Behavioral: Negative.    Physical Exam   Blood pressure 128/67, pulse (!) 108, temperature 97.7 F (36.5 C), resp. rate 18, height 5\' 5"  (1.651 m), weight 118.4 kg, unknown if currently breastfeeding.  Physical Exam Vitals and nursing note reviewed.  Constitutional:       General: She is not in acute distress.    Appearance: She is obese.  HENT:     Head: Normocephalic.  Cardiovascular:     Rate and Rhythm: Normal rate and regular rhythm.  Pulmonary:     Effort: Pulmonary effort is normal. No respiratory distress.     Breath sounds: Normal breath sounds. No wheezing.  Abdominal:     Palpations: Abdomen is soft. There is no mass.     Tenderness: There is no abdominal tenderness. There is no guarding.     Comments: Gravid   Skin:    General: Skin is warm and dry.  Neurological:     Mental Status: She is alert and oriented to person, place, and time.  Psychiatric:        Mood and Affect: Mood normal.        Behavior: Behavior normal.        Thought Content: Thought content normal.    Dilation: Fingertip Effacement (%): Thick Cervical Position: Posterior Station: Ballotable Presentation: Vertex Exam by:: Wende Bushy CNM  Fetal monitoring:  140/moderate/+accels/ no decel  UI   MAU Course  Procedures  MDM Orders Placed This Encounter  Procedures  . Urinalysis, Routine w reflex microscopic Urine, Clean Catch  . Encourage/Reinforce Importance Po Fluids   Meds ordered this encounter  Medications  . NIFEdipine (PROCARDIA) capsule 10 mg   Results for orders placed or performed during the hospital encounter of 07/27/20 (from the past 24 hour(s))  Urinalysis, Routine w reflex microscopic Urine, Clean Catch     Status: Abnormal   Collection Time: 07/27/20  2:45 AM  Result Value Ref Range   Color, Urine YELLOW YELLOW   APPearance HAZY (A) CLEAR   Specific Gravity, Urine 1.006 1.005 - 1.030   pH 7.0 5.0 - 8.0   Glucose, UA NEGATIVE NEGATIVE mg/dL   Hgb urine dipstick NEGATIVE NEGATIVE   Bilirubin Urine NEGATIVE NEGATIVE   Ketones, ur NEGATIVE NEGATIVE mg/dL   Protein, ur NEGATIVE NEGATIVE mg/dL   Nitrite NEGATIVE NEGATIVE   Leukocytes,Ua NEGATIVE NEGATIVE   Reassessment after procardia series, patient reports that contractions and cramping  has resolved. Cervix rechecked and unchanged. Educated and discussed with patient to rest and hydrate over the next 24 hours. Discussed with patient that fetus is now head down which can initiate uterine contractions/cramping plus pelvic pressure for a few days then resolve, patient verbalizes understanding.   Discussed reasons to return to MAU. Follow up as scheduled in the office. Return to MAU as needed. PTL precautions reviewed with patient. Pt stable at time of discharge.   Assessment and  Plan   1. Uterine irritability   2. [redacted] weeks gestation of pregnancy   3. NST (non-stress test) reactive    Discharge home Follow up as scheduled in the office for prenatal care Return to MAU as needed for reasons discussed and/or emergencies  Hydration, rest and [place] Follow up.   Contact information: [place] [address] [phone]              Allergies as of 07/27/2020      Reactions   Latex Itching   inflamation      Medication List    TAKE these medications   COLLAGEN PO Take 1 tablet by mouth daily.   Fish Oil 1000 MG Caps Take 1,000 mg by mouth daily.   omeprazole 20 MG capsule Commonly known as: PRILOSEC Take 20 mg by mouth daily.   pantoprazole 20 MG tablet Commonly known as: PROTONIX Take 1 tablet (20 mg total) by mouth daily.   PrePLUS 27-1 MG Tabs Take 1 tablet by mouth daily.   progesterone 100 MG vaginal insert Commonly known as: ENDOMETRIN Place 100 mg vaginally 2 (two) times daily.   TURMERIC PO Take 1 tablet by mouth daily.       Lajean Manes CNM 07/27/2020, 4:44 AM

## 2020-09-21 LAB — OB RESULTS CONSOLE GBS: GBS: NEGATIVE

## 2020-09-30 ENCOUNTER — Telehealth (HOSPITAL_COMMUNITY): Payer: Self-pay | Admitting: *Deleted

## 2020-09-30 NOTE — Telephone Encounter (Signed)
Preadmission screen  

## 2020-10-01 ENCOUNTER — Encounter (HOSPITAL_COMMUNITY): Payer: Self-pay | Admitting: *Deleted

## 2020-10-01 ENCOUNTER — Telehealth (HOSPITAL_COMMUNITY): Payer: Self-pay | Admitting: *Deleted

## 2020-10-01 NOTE — Telephone Encounter (Signed)
Preadmission screen  

## 2020-10-06 ENCOUNTER — Other Ambulatory Visit (HOSPITAL_COMMUNITY)
Admission: RE | Admit: 2020-10-06 | Discharge: 2020-10-06 | Disposition: A | Discharge status: Home / Self Care | Payer: 59 | Source: Ambulatory Visit | Attending: Obstetrics and Gynecology | Admitting: Obstetrics and Gynecology

## 2020-10-06 DIAGNOSIS — Z01812 Encounter for preprocedural laboratory examination: Secondary | ICD-10-CM | POA: Insufficient documentation

## 2020-10-06 DIAGNOSIS — Z20822 Contact with and (suspected) exposure to covid-19: Secondary | ICD-10-CM | POA: Insufficient documentation

## 2020-10-06 LAB — SARS CORONAVIRUS 2 (TAT 6-24 HRS): SARS Coronavirus 2: NEGATIVE

## 2020-10-06 NOTE — H&P (Signed)
Amanda Daniels is a 35 y.o. female presenting for IOL s/s dates and LGA. She has a history of recurrent pregnancy loss and was on progesterone until 12 wga. She has known LGA with an EFW of 7#8 at 65 wga (93%ile). She has been counseled extensively in the office regarding her options and she strongly desires to try vaginally.   OB History    Gravida  4   Para  0   Term  0   Preterm  0   AB  3   Living  0     SAB  3   IAB  0   Ectopic  0   Multiple  0   Live Births  0          Past Medical History:  Diagnosis Date  . Anemia   . Asthma    last attack 10 yrs ago  . Endometriosis of ovary    back in 2007  . GERD (gastroesophageal reflux disease)    Past Surgical History:  Procedure Laterality Date  . laparoscopic ovaries     exploratory  . SCIATIC NERVE EXPLORATION Left 05/13/2016   Procedure: Left Sciatic Nerve Tumor Resection;  Surgeon: Earnie Larsson, MD;  Location: [facility];  Service: Neurosurgery;  Laterality: Left;   Family History: family history includes Diabetes in her maternal grandfather, maternal grandmother, paternal grandfather, and paternal grandmother; Hypertension in her maternal grandfather, maternal grandmother, paternal grandfather, and paternal grandmother. Social History:  reports that she has never smoked. She has never used smokeless tobacco. She reports previous alcohol use of about 2.0 standard drinks of alcohol per week. She reports that she does not use drugs.     Maternal Diabetes: No Genetic Screening: Normal Maternal Ultrasounds/Referrals: Normal Fetal Ultrasounds or other Referrals:  None Maternal Substance Abuse:  No Significant Maternal Medications:  None Significant Maternal Lab Results:  Group B Strep negative Other Comments:  None  Review of Systems History   unknown if currently breastfeeding. Exam Physical Exam  (from office) NAD, A&O NWOB Abd soft, nondistended, gravid  Prenatal labs: ABO, Rh: B/Positive/-- (08/26  0000) Antibody: Negative (08/26 0000) Rubella: Immune (08/26 0000) RPR: Nonreactive (08/26 0000)  HBsAg: Negative (08/26 0000)  HIV: Non-reactive (08/26 0000)  GBS:   negative  Assessment/Plan: 35 yo G4P0030 @ 39.4 wga presenting for IOL s/s LGA.  I reviewed LGA on [place] and specific fetal measurements in the office multiple times. I considered her past history. EFW is 93%ile and at 40 weeks is still <5000g. Her cervix in the office was 2cm dilated. Her pelvis felt adequate. EFW by my leopolds one week prior to IOL was 8#2. Both her mom and sisters have had a cesarean section. This would be her last choice. She would like to do everything she can do avoid a cesarean section. She is requesting an IOL in the 39 week period. We discussed IOL vs primary CS for LGA. Both benefits and risks reviewed of each. Risks of vaginal delivery with LGA reviewed such as shoulder dystocia, failure of induction, and the need for CS. CS risks reviewed including infxn, bleeding, damage to surrounding structure, need for additional procedures, possibility of placental abnormalities and need for CS in the future. If chooses CS and becomes pregnant again, we discussed risk of uterine rupture with 10% chance of neonatal morbidity/mortality. She voiced understanding and signed a consent form for IOL for LGA.  Cervix is 2cm but given first baby, will plan for cytotec followed by  pitocin/AROM when more favorable.  GBS negative.     Tyson Dense 10/06/2020, 11:39 AM

## 2020-10-08 ENCOUNTER — Inpatient Hospital Stay (HOSPITAL_COMMUNITY)
Admission: AD | Admit: 2020-10-08 | Discharge: 2020-10-11 | DRG: 805 | Disposition: A | Discharge status: Home / Self Care | Payer: 59 | Attending: Obstetrics & Gynecology | Admitting: Obstetrics & Gynecology

## 2020-10-08 ENCOUNTER — Inpatient Hospital Stay (HOSPITAL_COMMUNITY): Payer: 59 | Admitting: Anesthesiology

## 2020-10-08 ENCOUNTER — Other Ambulatory Visit: Payer: Self-pay

## 2020-10-08 ENCOUNTER — Encounter (HOSPITAL_COMMUNITY): Payer: Self-pay | Admitting: Obstetrics and Gynecology

## 2020-10-08 ENCOUNTER — Inpatient Hospital Stay (HOSPITAL_COMMUNITY): Payer: 59

## 2020-10-08 DIAGNOSIS — D62 Acute posthemorrhagic anemia: Secondary | ICD-10-CM | POA: Diagnosis not present

## 2020-10-08 DIAGNOSIS — Z3A39 39 weeks gestation of pregnancy: Secondary | ICD-10-CM

## 2020-10-08 DIAGNOSIS — O411230 Chorioamnionitis, third trimester, not applicable or unspecified: Secondary | ICD-10-CM | POA: Diagnosis present

## 2020-10-08 DIAGNOSIS — O9081 Anemia of the puerperium: Secondary | ICD-10-CM | POA: Diagnosis not present

## 2020-10-08 DIAGNOSIS — O99214 Obesity complicating childbirth: Secondary | ICD-10-CM | POA: Diagnosis present

## 2020-10-08 DIAGNOSIS — O3663X0 Maternal care for excessive fetal growth, third trimester, not applicable or unspecified: Principal | ICD-10-CM | POA: Diagnosis present

## 2020-10-08 DIAGNOSIS — Z20822 Contact with and (suspected) exposure to covid-19: Secondary | ICD-10-CM | POA: Diagnosis present

## 2020-10-08 DIAGNOSIS — Z3490 Encounter for supervision of normal pregnancy, unspecified, unspecified trimester: Secondary | ICD-10-CM

## 2020-10-08 LAB — CBC
HCT: 31.7 % — ABNORMAL LOW (ref 36.0–46.0)
Hemoglobin: 10.6 g/dL — ABNORMAL LOW (ref 12.0–15.0)
MCH: 27.5 pg (ref 26.0–34.0)
MCHC: 33.4 g/dL (ref 30.0–36.0)
MCV: 82.3 fL (ref 80.0–100.0)
Platelets: 369 10*3/uL (ref 150–400)
RBC: 3.85 MIL/uL — ABNORMAL LOW (ref 3.87–5.11)
RDW: 14.7 % (ref 11.5–15.5)
WBC: 11.9 10*3/uL — ABNORMAL HIGH (ref 4.0–10.5)
nRBC: 0 % (ref 0.0–0.2)

## 2020-10-08 LAB — TYPE AND SCREEN
ABO/RH(D): B POS
Antibody Screen: NEGATIVE

## 2020-10-08 LAB — RPR: RPR Ser Ql: NONREACTIVE

## 2020-10-08 MED ORDER — ZOLPIDEM TARTRATE 5 MG PO TABS: 5.0000 mg | ORAL_TABLET | Freq: Every evening | ORAL | Status: DC | PRN

## 2020-10-08 MED ORDER — OXYCODONE-ACETAMINOPHEN 5-325 MG PO TABS: 2.0000 | ORAL_TABLET | ORAL | Status: DC | PRN

## 2020-10-08 MED ORDER — PHENYLEPHRINE 40 MCG/ML (10ML) SYRINGE FOR IV PUSH (FOR BLOOD PRESSURE SUPPORT): 80.0000 ug | PREFILLED_SYRINGE | INTRAVENOUS | Status: DC | PRN

## 2020-10-08 MED ORDER — HYDROXYZINE HCL 50 MG PO TABS: 50.0000 mg | ORAL_TABLET | Freq: Four times a day (QID) | ORAL | Status: DC | PRN

## 2020-10-08 MED ORDER — LACTATED RINGERS IV SOLN
500.0000 mL | Freq: Once | INTRAVENOUS | Status: AC
  Administered 2020-10-08: 500 mL via INTRAVENOUS

## 2020-10-08 MED ORDER — ONDANSETRON HCL 4 MG/2ML IJ SOLN
4.0000 mg | Freq: Four times a day (QID) | INTRAMUSCULAR | Status: DC | PRN
  Administered 2020-10-09: 4 mg via INTRAVENOUS
  Filled 2020-10-08: qty 2

## 2020-10-08 MED ORDER — ACETAMINOPHEN 325 MG PO TABS: 650.0000 mg | ORAL_TABLET | ORAL | Status: DC | PRN

## 2020-10-08 MED ORDER — BUTORPHANOL TARTRATE 1 MG/ML IJ SOLN: 1.0000 mg | INTRAMUSCULAR | Status: DC | PRN

## 2020-10-08 MED ORDER — LACTATED RINGERS IV SOLN
500.0000 mL | INTRAVENOUS | Status: DC | PRN
Start: 2020-10-08 — End: 2020-10-09
  Administered 2020-10-08: 500 mL via INTRAVENOUS

## 2020-10-08 MED ORDER — EPHEDRINE 5 MG/ML INJ: 10.0000 mg | INTRAVENOUS | Status: DC | PRN

## 2020-10-08 MED ORDER — LIDOCAINE HCL (PF) 1 % IJ SOLN
INTRAMUSCULAR | Status: DC | PRN
  Administered 2020-10-08: 8 mL via EPIDURAL

## 2020-10-08 MED ORDER — OXYCODONE-ACETAMINOPHEN 5-325 MG PO TABS: 1.0000 | ORAL_TABLET | ORAL | Status: DC | PRN

## 2020-10-08 MED ORDER — OXYTOCIN-SODIUM CHLORIDE 30-0.9 UT/500ML-% IV SOLN
2.5000 [IU]/h | INTRAVENOUS | Status: DC
  Filled 2020-10-08: qty 500

## 2020-10-08 MED ORDER — TERBUTALINE SULFATE 1 MG/ML IJ SOLN: 0.2500 mg | Freq: Once | INTRAMUSCULAR | Status: DC | PRN

## 2020-10-08 MED ORDER — FENTANYL-BUPIVACAINE-NACL 0.5-0.125-0.9 MG/250ML-% EP SOLN
12.0000 mL/h | EPIDURAL | Status: DC | PRN
  Administered 2020-10-08 – 2020-10-09 (×2): 12 mL/h via EPIDURAL
  Filled 2020-10-08 (×2): qty 250

## 2020-10-08 MED ORDER — OXYTOCIN-SODIUM CHLORIDE 30-0.9 UT/500ML-% IV SOLN
1.0000 m[IU]/min | INTRAVENOUS | Status: DC
  Administered 2020-10-08: 2 m[IU]/min via INTRAVENOUS
  Administered 2020-10-09: 28 m[IU]/min via INTRAVENOUS
  Filled 2020-10-08: qty 500

## 2020-10-08 MED ORDER — DIPHENHYDRAMINE HCL 50 MG/ML IJ SOLN: 12.5000 mg | INTRAMUSCULAR | Status: DC | PRN

## 2020-10-08 MED ORDER — SOD CITRATE-CITRIC ACID 500-334 MG/5ML PO SOLN
30.0000 mL | ORAL | Status: DC | PRN
  Administered 2020-10-08: 30 mL via ORAL
  Filled 2020-10-08: qty 15

## 2020-10-08 MED ORDER — OXYTOCIN BOLUS FROM INFUSION
333.0000 mL | Freq: Once | INTRAVENOUS | Status: AC
  Administered 2020-10-09: 333 mL via INTRAVENOUS

## 2020-10-08 MED ORDER — LACTATED RINGERS IV SOLN: INTRAVENOUS | Status: DC

## 2020-10-08 MED ORDER — LIDOCAINE HCL (PF) 1 % IJ SOLN: 30.0000 mL | INTRAMUSCULAR | Status: DC | PRN

## 2020-10-08 MED ORDER — MISOPROSTOL 25 MCG QUARTER TABLET
25.0000 ug | ORAL_TABLET | ORAL | Status: DC | PRN
  Administered 2020-10-08: 25 ug via VAGINAL
  Filled 2020-10-08: qty 1

## 2020-10-08 NOTE — Progress Notes (Addendum)
Doing well. S/p cytotec x 1 SVE 2/60/-2, AROM clear fluid Start pitocin.

## 2020-10-08 NOTE — Progress Notes (Signed)
Patient now just getting comf with CLE and sleeping. IUPC w/out adequate contractions - MVUs 50 total.  Pitocin currently at 18. Increase pitocin.  Cat 1 tracing.

## 2020-10-08 NOTE — Anesthesia Preprocedure Evaluation (Addendum)
Anesthesia Evaluation   Patient awake    Reviewed: Allergy & Precautions, NPO status , Patient's Chart, lab work & pertinent test results  Airway Mallampati: III  TM Distance: >3 FB Neck ROM: Full    Dental no notable dental hx.    Pulmonary asthma ,    Pulmonary exam normal breath sounds clear to auscultation       Cardiovascular negative cardio ROS Normal cardiovascular exam Rhythm:Regular Rate:Normal     Neuro/Psych  Neuromuscular disease (h/o sciatic nerve tumor resection) negative psych ROS   GI/Hepatic Neg liver ROS, GERD  ,  Endo/Other  Morbid obesity  Renal/GU negative Renal ROS  negative genitourinary   Musculoskeletal negative musculoskeletal ROS (+)   Abdominal   Peds negative pediatric ROS (+)  Hematology negative hematology ROS (+) anemia ,   Anesthesia Other Findings Left hamstring weakness, baseline  Reproductive/Obstetrics (+) Pregnancy                            Anesthesia Physical Anesthesia Plan  ASA: III  Anesthesia Plan: Epidural   Post-op Pain Management:    Induction:   PONV Risk Score and Plan: 2  Airway Management Planned: Natural Airway  Additional Equipment:   Intra-op Plan:   Post-operative Plan:   Informed Consent: I have reviewed the patients History and Physical, chart, labs and discussed the procedure including the risks, benefits and alternatives for the proposed anesthesia with the patient or authorized representative who has indicated his/her understanding and acceptance.       Plan Discussed with: Anesthesiologist  Anesthesia Plan Comments:         Anesthesia Quick Evaluation

## 2020-10-08 NOTE — Anesthesia Procedure Notes (Signed)
Epidural Patient location during procedure: OB Start time: 10/08/2020 7:02 PM End time: 10/08/2020 7:12 PM  Staffing Anesthesiologist: Merlinda Frederick, MD Performed: anesthesiologist   Preanesthetic Checklist Completed: patient identified, IV checked, site marked, risks and benefits discussed, monitors and equipment checked, pre-op evaluation and timeout performed  Epidural Patient position: sitting Prep: DuraPrep Patient monitoring: heart rate, cardiac monitor, continuous pulse ox and blood pressure Approach: midline Location: L3-L4 Injection technique: LOR saline  Needle:  Needle type: Tuohy  Needle gauge: 17 G Needle length: 9 cm Needle insertion depth: 10 cm Catheter type: closed end flexible Catheter size: 20 Guage Catheter at skin depth: 15 cm Test dose: negative and Other  Assessment Events: blood not aspirated, injection not painful, no injection resistance and negative IV test  Additional Notes Informed consent obtained prior to proceeding including risk of failure, 1% risk of PDPH, risk of minor discomfort and bruising.  Discussed rare but serious complications including epidural abscess, permanent nerve injury, epidural hematoma.  Discussed alternatives to epidural analgesia and patient desires to proceed.  Timeout performed pre-procedure verifying patient name, procedure, and platelet count.  Patient tolerated procedure well.

## 2020-10-08 NOTE — Progress Notes (Signed)
Patient desires an un-medicated birth.  Her birth plan was discussed. Doula at bedside. SVE 3/80/-2 Progressing well on pitocin.

## 2020-10-08 NOTE — Plan of Care (Signed)
  Problem: Education: Goal: Knowledge of Childbirth will improve Outcome: Progressing Goal: Ability to make informed decisions regarding treatment and plan of care will improve Outcome: Progressing Goal: Ability to state and carry out methods to decrease the pain will improve Outcome: Progressing   

## 2020-10-08 NOTE — Progress Notes (Signed)
Four hours without cervical change. Painful contractions continue every two minutes.  Iupc placed with permission of patient and her doula. Continue Pitocin titration.

## 2020-10-09 ENCOUNTER — Encounter (HOSPITAL_COMMUNITY): Payer: Self-pay | Admitting: Obstetrics and Gynecology

## 2020-10-09 MED ORDER — ACETAMINOPHEN 500 MG PO TABS
1000.0000 mg | ORAL_TABLET | Freq: Four times a day (QID) | ORAL | Status: DC | PRN
  Administered 2020-10-09: 1000 mg via ORAL
  Filled 2020-10-09: qty 2

## 2020-10-09 MED ORDER — DIPHENOXYLATE-ATROPINE 2.5-0.025 MG PO TABS
2.0000 | ORAL_TABLET | Freq: Once | ORAL | Status: AC
  Administered 2020-10-09: 2 via ORAL
  Filled 2020-10-09: qty 2

## 2020-10-09 MED ORDER — CARBOPROST TROMETHAMINE 250 MCG/ML IM SOLN: 250.0000 ug | Freq: Once | INTRAMUSCULAR | Status: AC

## 2020-10-09 MED ORDER — MISOPROSTOL 200 MCG PO TABS: 1000.0000 ug | ORAL_TABLET | Freq: Once | ORAL | Status: AC

## 2020-10-09 MED ORDER — IBUPROFEN 600 MG PO TABS
600.0000 mg | ORAL_TABLET | Freq: Four times a day (QID) | ORAL | Status: DC
  Administered 2020-10-09 – 2020-10-11 (×8): 600 mg via ORAL
  Filled 2020-10-09 (×8): qty 1

## 2020-10-09 MED ORDER — PRENATAL MULTIVITAMIN CH
1.0000 | ORAL_TABLET | Freq: Every day | ORAL | Status: DC
  Administered 2020-10-10 – 2020-10-11 (×2): 1 via ORAL
  Filled 2020-10-09 (×2): qty 1

## 2020-10-09 MED ORDER — WITCH HAZEL-GLYCERIN EX PADS
1.0000 "application " | MEDICATED_PAD | CUTANEOUS | Status: DC | PRN
  Administered 2020-10-09: 1 via TOPICAL

## 2020-10-09 MED ORDER — DIPHENHYDRAMINE HCL 25 MG PO CAPS: 25.0000 mg | ORAL_CAPSULE | Freq: Four times a day (QID) | ORAL | Status: DC | PRN

## 2020-10-09 MED ORDER — OXYCODONE-ACETAMINOPHEN 5-325 MG PO TABS
2.0000 | ORAL_TABLET | ORAL | Status: DC | PRN
Start: 2020-10-09 — End: 2020-10-11

## 2020-10-09 MED ORDER — METHYLERGONOVINE MALEATE 0.2 MG/ML IJ SOLN: 0.2000 mg | Freq: Once | INTRAMUSCULAR | Status: DC

## 2020-10-09 MED ORDER — SIMETHICONE 80 MG PO CHEW
80.0000 mg | CHEWABLE_TABLET | ORAL | Status: DC | PRN
Start: 2020-10-09 — End: 2020-10-11

## 2020-10-09 MED ORDER — MISOPROSTOL 200 MCG PO TABS
ORAL_TABLET | ORAL | Status: AC
  Administered 2020-10-09: 1000 ug via RECTAL
  Filled 2020-10-09: qty 5

## 2020-10-09 MED ORDER — COCONUT OIL OIL
1.0000 "application " | TOPICAL_OIL | Status: DC | PRN
  Administered 2020-10-10: 1 via TOPICAL

## 2020-10-09 MED ORDER — CARBOPROST TROMETHAMINE 250 MCG/ML IM SOLN
INTRAMUSCULAR | Status: AC
  Administered 2020-10-09: 250 ug via INTRAMUSCULAR
  Filled 2020-10-09: qty 1

## 2020-10-09 MED ORDER — METHYLERGONOVINE MALEATE 0.2 MG/ML IJ SOLN
INTRAMUSCULAR | Status: AC
  Administered 2020-10-09: 0.2 mg
  Filled 2020-10-09: qty 1

## 2020-10-09 MED ORDER — ACETAMINOPHEN 325 MG PO TABS
650.0000 mg | ORAL_TABLET | ORAL | Status: DC | PRN
  Administered 2020-10-09 – 2020-10-11 (×4): 650 mg via ORAL
  Filled 2020-10-09 (×4): qty 2

## 2020-10-09 MED ORDER — ONDANSETRON HCL 4 MG PO TABS: 4.0000 mg | ORAL_TABLET | ORAL | Status: DC | PRN

## 2020-10-09 MED ORDER — OXYCODONE-ACETAMINOPHEN 5-325 MG PO TABS
1.0000 | ORAL_TABLET | ORAL | Status: DC | PRN
  Administered 2020-10-10: 1 via ORAL
  Filled 2020-10-09: qty 1

## 2020-10-09 MED ORDER — ZOLPIDEM TARTRATE 5 MG PO TABS: 5.0000 mg | ORAL_TABLET | Freq: Every evening | ORAL | Status: DC | PRN

## 2020-10-09 MED ORDER — TRANEXAMIC ACID-NACL 1000-0.7 MG/100ML-% IV SOLN
INTRAVENOUS | Status: AC
  Administered 2020-10-09: 1000 mg via INTRAVENOUS
  Filled 2020-10-09: qty 100

## 2020-10-09 MED ORDER — SENNOSIDES-DOCUSATE SODIUM 8.6-50 MG PO TABS
2.0000 | ORAL_TABLET | Freq: Every day | ORAL | Status: DC
  Administered 2020-10-11: 2 via ORAL
  Filled 2020-10-09: qty 2

## 2020-10-09 MED ORDER — DIBUCAINE (PERIANAL) 1 % EX OINT: 1.0000 "application " | TOPICAL_OINTMENT | CUTANEOUS | Status: DC | PRN

## 2020-10-09 MED ORDER — GENTAMICIN SULFATE 40 MG/ML IJ SOLN
5.0000 mg/kg | INTRAVENOUS | Status: DC
  Administered 2020-10-09: 430 mg via INTRAVENOUS
  Filled 2020-10-09: qty 10.75

## 2020-10-09 MED ORDER — ONDANSETRON HCL 4 MG/2ML IJ SOLN: 4.0000 mg | INTRAMUSCULAR | Status: DC | PRN

## 2020-10-09 MED ORDER — SODIUM CHLORIDE 0.9 % IV SOLN
2.0000 g | Freq: Four times a day (QID) | INTRAVENOUS | Status: DC
  Administered 2020-10-09: 2 g via INTRAVENOUS
  Filled 2020-10-09: qty 2000

## 2020-10-09 MED ORDER — TRANEXAMIC ACID-NACL 1000-0.7 MG/100ML-% IV SOLN: 1000.0000 mg | INTRAVENOUS | Status: AC

## 2020-10-09 MED ORDER — FERROUS SULFATE 325 (65 FE) MG PO TABS
325.0000 mg | ORAL_TABLET | Freq: Every day | ORAL | Status: DC
  Administered 2020-10-10 – 2020-10-11 (×2): 325 mg via ORAL
  Filled 2020-10-09 (×2): qty 1

## 2020-10-09 MED ORDER — ALBUTEROL SULFATE (2.5 MG/3ML) 0.083% IN NEBU: 3.0000 mL | INHALATION_SOLUTION | Freq: Four times a day (QID) | RESPIRATORY_TRACT | Status: DC | PRN

## 2020-10-09 MED ORDER — TETANUS-DIPHTH-ACELL PERTUSSIS 5-2.5-18.5 LF-MCG/0.5 IM SUSY: 0.5000 mL | PREFILLED_SYRINGE | Freq: Once | INTRAMUSCULAR | Status: DC

## 2020-10-09 MED ORDER — BENZOCAINE-MENTHOL 20-0.5 % EX AERO
1.0000 "application " | INHALATION_SPRAY | CUTANEOUS | Status: DC | PRN
  Administered 2020-10-09 – 2020-10-10 (×2): 1 via TOPICAL
  Filled 2020-10-09 (×2): qty 56

## 2020-10-09 NOTE — Lactation Note (Signed)
This note was copied from a baby's chart. Lactation Consultation Note  Patient Name: Amanda Daniels Date: 10/09/2020 Reason for consult: Initial assessment Age:35 hours  Initial visit to 35 hours old infant of a P1 mother. Infant is skin to skin with mother upon arrival. Mother states she has been stimulating her breasts for a few days and has been able to see some colostrum.  Reviewed hand expression, demonstrated technique, able to see some drops. Noticed edematous nipples. Used hand pump able to pump ~2 mL. Fingerfed infant.  Infant started cueing. Set up support pillows for football position to right breast. Demonstrated alignment. Infant latches but does not sustain latch for more than 1 minute. Observed a few swallows with stimulation.  LC encouraged mother to pump and contact LC to demonstrate spoon-feeding.    Plan: 1-Skin to skin 2-Aim for a deep, comfortable latch 3-Breastfeeding on demand or 8-12 times in 24h period. 4-Keep infant awake during breastfeeding session: massaging breast, infant's hand/shoulder/feet 5-Monitor voids and stools as signs good intake.  6-Encouraged maternal rest, hydration and food intake.  7-Contact LC as needed for feeds/support/concerns/questions   All questions answered at this time.   Maternal Data Has patient been taught Hand Expression?: Yes Does the patient have breastfeeding experience prior to this delivery?: No  Feeding Mother's Current Feeding Choice: Breast Milk  LATCH Score Latch: Repeated attempts needed to sustain latch, nipple held in mouth throughout feeding, stimulation needed to elicit sucking reflex.  Audible Swallowing: A few with stimulation  Type of Nipple: Flat  Comfort (Breast/Nipple): Soft / non-tender (edematous)  Hold (Positioning): Assistance needed to correctly position infant at breast and maintain latch.  LATCH Score: 6   Lactation Tools Discussed/Used Tools: Pump;Shells;Flanges Flange Size:  24 Breast pump type: Manual Reason for Pumping: nipple eversion Pumping frequency: prior to feeds Pumped volume: 2 mL  Interventions Interventions: Breast feeding basics reviewed;Assisted with latch;Skin to skin;Breast massage;Hand express;Adjust position;Breast compression;Reverse pressure;Pre-pump if needed;Hand pump;Shells;Expressed milk;Position options;Support pillows;Education  Discharge Pump: Personal;Manual  Consult Status Consult Status: Follow-up Date: 10/10/20 Follow-up type: In-patient    Guilherme Schwenke A Higuera Ancidey 10/09/2020, 9:27 PM

## 2020-10-09 NOTE — Lactation Note (Signed)
This note was copied from a baby's chart. Lactation Consultation Note  Patient Name: Amanda Daniels Date: 10/09/2020 Reason for consult: Follow-up assessment;Mother's request;1st time breastfeeding;Primapara;Term Age:35 hours  LC back in to room per mother's request. Mother states she used her personal "willow" pump and collected ~62mL combined of EBM in 10 minutes.  Father of infant is supportive and hands-on. He spoonfed ~63mL of EBM.  Infant seemed content. LC assisted with swaddling and place her in basinet.  Per RN, mother's nipples look completely everted after pumping.  Encouraged mother to pump both breast for 15 minutes (mimicking initiation setting) prior to feeding. Try latching infant right after pumping and later supplement with EBM. Reinforced the importance of resting and self-care. Encouraged to call [facility] for needs/assistance/concerns.   Maternal Data Has patient been taught Hand Expression?: Yes Does the patient have breastfeeding experience prior to this delivery?: No  Feeding Mother's Current Feeding Choice: Breast Milk  LATCH Score Latch: Repeated attempts needed to sustain latch, nipple held in mouth throughout feeding, stimulation needed to elicit sucking reflex.  Audible Swallowing: A few with stimulation  Type of Nipple: Flat  Comfort (Breast/Nipple): Soft / non-tender (edematous)  Hold (Positioning): Assistance needed to correctly position infant at breast and maintain latch.  LATCH Score: 6   Lactation Tools Discussed/Used Tools: Pump Flange Size: 24 Breast pump type: Double-Electric Breast Pump;Other (comment) (personal DEB wireless pump) Reason for Pumping: stimulation and supplementation Pumping frequency: as needed Pumped volume: 4 mL  Interventions Interventions: Breast feeding basics reviewed;Expressed milk;Education;DEBP;Pre-pump if needed  Discharge Pump: Personal;Manual  Consult Status Consult Status: Follow-up Date:  10/10/20 Follow-up type: In-patient    Amanda Daniels 10/09/2020, 11:31 PM

## 2020-10-09 NOTE — Lactation Note (Signed)
This note was copied from a baby's chart. Lactation Consultation Note  Patient Name: Amanda Daniels OMVEH'M Date: 10/09/2020   Age:35 min LC introduced herself. Mother reports that she would like to attempt to breastfeed. Infant placed in football hold. Assist mother with hand expression . Infant latched only on the tip of the nipple . Mother reconized  that she was not latched on correctly. Mother reports that she took breast feeding classes. Several attempts to latch infant and then switched to cross cradle. Infant unable to sustained latch. Placed infant back on mothers chest. Mother reports that she will call when she gets to her room.  Maternal Data    Feeding    LATCH Score                    Lactation Tools Discussed/Used    Interventions    Discharge    Consult Status      Darla Lesches 10/09/2020, 2:58 PM

## 2020-10-09 NOTE — Progress Notes (Signed)
Amanda Daniels is a 35 y.o. G4P0030 at [redacted]w[redacted]d by ultrasound admitted for induction of labor due to Elective at term.  Subjective: Comfortable with CLEA  Objective: BP 136/78   Pulse (!) 130   Temp (!) 101 F (38.3 C) (Axillary)   Resp 18   Ht 5\' 5"  (1.651 m)   Wt 130.1 kg   SpO2 97%   BMI 47.74 kg/m  I/O last 3 completed shifts: In: 909.2 [I.V.:909.2] Out: 2000 [Urine:2000] No intake/output data recorded.  FHT:  FHR: 195 bpm, variability: moderate,  accelerations:  Abscent,  decelerations:  Absent UC:   regular, every 2 minutes SVE:   Dilation: 10 Effacement (%): 90 Station: Plus 2 Exam by:: Dr. Lynnette Caffey  Labs: Lab Results  Component Value Date   WBC 11.9 (H) 10/08/2020   HGB 10.6 (L) 10/08/2020   HCT 31.7 (L) 10/08/2020   MCV 82.3 10/08/2020   PLT 369 10/08/2020    Assessment / Plan: Induction of labor due to elective at term,  progressing well on pitocin  Labor: Progressing normally; will push Preeclampsia:  n/a Fetal Wellbeing:  Category II; newly diagnosed chorioamnionitis.  Amp/Gent/Tylenol ordered Pain Control:  Epidural I/D:  n/a Anticipated MOD:  NSVD  Linda Hedges 10/09/2020, 9:19 AM

## 2020-10-09 NOTE — Progress Notes (Signed)
MAIMOUNA RONDEAU is a 35 y.o. G4P0030 at [redacted]w[redacted]d by ultrasound admitted for induction of labor due to Elective at term.  Subjective: No c/o.  Comfortable with CLEA  Objective: BP (!) 127/59   Pulse (!) 119   Temp 98.6 F (37 C) (Oral)   Resp 17   Ht 5\' 5"  (1.651 m)   Wt 130.1 kg   SpO2 100%   BMI 47.74 kg/m  I/O last 3 completed shifts: In: 909.2 [I.V.:909.2] Out: 2000 [Urine:2000] No intake/output data recorded.  FHT:  FHR: 170 bpm, variability: moderate,  accelerations:  Present,  decelerations:  Absent UC:   regular, every 2 minutes SVE:   Dilation: 8 Effacement (%): 90 Station: 0 Exam by:: West Pugh, RN  Labs: Lab Results  Component Value Date   WBC 11.9 (H) 10/08/2020   HGB 10.6 (L) 10/08/2020   HCT 31.7 (L) 10/08/2020   MCV 82.3 10/08/2020   PLT 369 10/08/2020    Assessment / Plan: Induction of labor due to elective at term,  progressing well on pitocin  Labor: Progressing normally Preeclampsia:  n/a Fetal Wellbeing:  Category I Pain Control:  Epidural I/D:  n/a Anticipated MOD:  NSVD  Linda Hedges 10/09/2020, 8:25 AM

## 2020-10-09 NOTE — Progress Notes (Signed)
Comf with CLE. Was able to rest for a few hours.  SVE 6.5/90/-1, IUPC replaced. A small amount of caput forming.  MVUs 180 - not quite adequate but patient continues to make change.  Pitocin at 28. I discussed her labor course. Now that she is in active labor I anticipate she should make more rapid change.  We again discussed LGA and the risks including cesarean section and shoulder dystocia.    Arty Baumgartner MD

## 2020-10-09 NOTE — Progress Notes (Signed)
4.5cm.  Iupc with inadequate contractions. Iupc flushed and moved. However, patient making cervical change. Continue Pitocin.

## 2020-10-10 LAB — CBC
HCT: 25.7 % — ABNORMAL LOW (ref 36.0–46.0)
Hemoglobin: 8.4 g/dL — ABNORMAL LOW (ref 12.0–15.0)
MCH: 27.4 pg (ref 26.0–34.0)
MCHC: 32.7 g/dL (ref 30.0–36.0)
MCV: 83.7 fL (ref 80.0–100.0)
Platelets: 305 10*3/uL (ref 150–400)
RBC: 3.07 MIL/uL — ABNORMAL LOW (ref 3.87–5.11)
RDW: 15.1 % (ref 11.5–15.5)
WBC: 20.6 10*3/uL — ABNORMAL HIGH (ref 4.0–10.5)
nRBC: 0 % (ref 0.0–0.2)

## 2020-10-10 NOTE — Lactation Note (Signed)
This note was copied from a baby's chart. Lactation Consultation Note  Patient Name: Amanda Daniels Date: 10/10/2020 Reason for consult: Follow-up assessment Age:35 hours  P1, Baby sleeping in father's arms.  Recently received 15 ml of donor milk. Has not attempted latching since last night.  Mother states latching is difficult. Mother is pumping with her personal "Willow" pump.  She claims she is pumping drops. Provided education of the importance of continuing to practice at the breast and request assistance while here. Mother states she will call for next feeding when baby cues.    Feeding Mother's Current Feeding Choice: Breast Milk and Donor Milk Nipple Type: Slow - flow  Lactation Tools Discussed/Used  Tools: Pump Breast pump type: Double-Electric Breast Pump Reason for Pumping:  (difficulty latching)  Interventions Interventions: Education    Consult Status Consult Status: Follow-up Date: 10/10/20 Follow-up type: In-patient  Amanda Daniels [facility] 10/10/2020, 2:19 PM

## 2020-10-10 NOTE — Anesthesia Postprocedure Evaluation (Signed)
Anesthesia Post Note  Patient: Amanda Daniels  Procedure(s) Performed: AN AD [place]     Patient location during evaluation: Mother Baby Anesthesia Type: Epidural Level of consciousness: awake and alert, oriented and patient cooperative Pain management: pain level controlled Vital Signs Assessment: post-procedure vital signs reviewed and stable Respiratory status: spontaneous breathing Cardiovascular status: stable Postop Assessment: no headache, epidural receding, patient able to bend at knees and no signs of nausea or vomiting Anesthetic complications: no Comments: Pt. States she is walking. Pain score 3.    No complications documented.  Last Vitals:  Vitals:   10/09/20 2152 10/10/20 0540  BP: 122/69 99/63  Pulse: 90 74  Resp: 16 16  Temp: 36.5 C 36.5 C  SpO2: 95% 99%    Last Pain:  Vitals:   10/10/20 0540  TempSrc: Oral  PainSc: 2    Pain Goal: Patients Stated Pain Goal: 0 (10/10/20 0540)                 Rico Sheehan

## 2020-10-10 NOTE — Progress Notes (Signed)
Post Partum Day 1 Subjective: no complaints, up ad lib, voiding and tolerating PO  Objective: Blood pressure 99/63, pulse 74, temperature 97.7 F (36.5 C), temperature source Oral, resp. rate 16, height 5\' 5"  (1.651 m), weight 130.1 kg, SpO2 99 %, unknown if currently breastfeeding.  Physical Exam:  General: alert, cooperative and appears stated age Lochia: appropriate Uterine Fundus: firm Incision: healing well, no significant drainage, no dehiscence DVT Evaluation: No evidence of DVT seen on physical exam. Negative Homan's sign. No cords or calf tenderness.  Recent Labs    10/08/20 0025 10/10/20 0518  HGB 10.6* 8.4*  HCT 31.7* 25.7*    Assessment/Plan: Plan for discharge tomorrow and Breastfeeding  Acute blood loss on chronic anemia-FeSO4 ordered. Chorio-afebrile since delivery; s/p 1 dose IV Amp/Gent PPH-1100 cc EBL.  Received methergine, hemabate, cytotec, TXA.  Stable lochia, vital and labs.   LOS: 2 days   Linda Hedges 10/10/2020, 7:11 AM

## 2020-10-11 ENCOUNTER — Ambulatory Visit: Payer: Self-pay

## 2020-10-11 MED ORDER — IBUPROFEN 600 MG PO TABS: 600.0000 mg | ORAL_TABLET | Freq: Four times a day (QID) | ORAL | 0 refills | Status: DC

## 2020-10-11 MED ORDER — FERROUS SULFATE 325 (65 FE) MG PO TABS
325.0000 mg | ORAL_TABLET | Freq: Every day | ORAL | 0 refills | Status: DC
Start: 2020-10-11 — End: 2021-12-13

## 2020-10-11 MED ORDER — PANTOPRAZOLE SODIUM 20 MG PO TBEC
20.0000 mg | DELAYED_RELEASE_TABLET | Freq: Every day | ORAL | Status: DC
  Administered 2020-10-11: 20 mg via ORAL
  Filled 2020-10-11: qty 1

## 2020-10-11 NOTE — Discharge Instructions (Signed)
Call MD for T>100.4, heavy vaginal bleeding, severe abdominal pain, or respiratory distress.  Call office to schedule postpartum visit in 6 weeks.  Pelvic rest x 6 weeks.   

## 2020-10-11 NOTE — Discharge Summary (Signed)
Postpartum Discharge Summary    Patient Name: Amanda Daniels DOB: 1986/06/12 MRN: [id number]  Date of admission: 10/08/2020 Delivery date:10/09/2020  Delivering provider: Linda Hedges  Date of discharge: 10/11/2020  Admitting diagnosis: Pregnancy [Z34.90] Intrauterine pregnancy: [redacted]w[redacted]d    Secondary diagnosis:  Active Problems:   Pregnancy  Additional problems: Postpartum hemorrhage, chorioamnionitis    Discharge diagnosis: Term Pregnancy Delivered, PPH and chorioamnionitis                                              Post partum procedures:none Augmentation: AROM, Pitocin and Cytotec Complications: Intrauterine Inflammation or infection (Chorioamniotis) and Hemorrhage>10020m Hospital course: Induction of Labor With Vaginal Delivery   [age].o. yo [id number] 39103w5ds admitted to the hospital 10/08/2020 for induction of labor.  Indication for induction: Elective.  Patient had an uncomplicated labor course as follows: Membrane Rupture Time/Date: 8:00 AM ,10/08/2020   Delivery Method:Vaginal, Vacuum (Extractor)  Episiotomy: None  Lacerations:  2nd degree  Details of delivery can be found in separate delivery note.  Patient had a routine postpartum course. Patient is discharged home 10/11/20.  Newborn Data: Birth date:10/09/2020  Birth time:1:30 PM  Gender:Female  Living status:Living  Apgars:8 ,9  Weight:3710 g   Magnesium Sulfate received: No BMZ received: No Rhophylac:No MMR:No T-DaP:Given prenatally Flu: No Transfusion:No  Physical exam  Vitals:   10/10/20 0540 10/10/20 1419 10/10/20 2206 10/11/20 0510  BP: 99/63 107/63 122/67 92/71  Pulse: 74 68 90 76  Resp: _0 Temp: 97.7 F (36.5 C) (!) 97.5 F (36.4 C)  97.9 F (36.6 C)  TempSrc: Oral Oral  Axillary  SpO2: 99% 99% 99% 100%  Weight:      Height:       General: alert, cooperative and no distress Lochia: appropriate Uterine Fundus: firm Incision: Healing well with no significant drainage DVT  Evaluation: No evidence of DVT seen on physical exam. Labs: Lab Results  Component Value Date   WBC 20.6 (H) 10/10/2020   HGB 8.4 (L) 10/10/2020   HCT 25.7 (L) 10/10/2020   MCV 83.7 10/10/2020   PLT 305 10/10/2020   CMP Latest Ref Rng & Units 05/09/2016  Glucose 65 - 99 mg/dL 84  BUN 6 - 20 mg/dL 9  Creatinine 0.44 - 1.00 mg/dL 0.69  Sodium 135 - 145 mmol/L 137  Potassium 3.5 - 5.1 mmol/L 3.9  Chloride 101 - 111 mmol/L 105  CO2 22 - 32 mmol/L 25  Calcium 8.9 - 10.3 mg/dL 9.2   Edinburgh Score: Edinburgh Postnatal Depression Scale Screening Tool 10/09/2020  I have been able to laugh and see the funny side of things. 0  I have looked forward with enjoyment to things. 0  I have blamed myself unnecessarily when things went wrong. 1  I have been anxious or worried for no good reason. 1  I have felt scared or panicky for no good reason. 0  Things have been getting on top of me. 1  I have been so unhappy that I have had difficulty sleeping. 0  I have felt sad or miserable. 0  I have been so unhappy that I have been crying. 0  The thought of harming myself has occurred to me. 0  Edinburgh Postnatal Depression Scale Total 3     After visit meds:  Allergies as of  10/11/2020      Reactions   Latex Itching   inflamation      Medication List    STOP taking these medications   Fish Oil 1000 MG Caps     TAKE these medications   albuterol 108 (90 Base) MCG/ACT inhaler Commonly known as: VENTOLIN HFA Inhale 1-2 puffs into the lungs every 6 (six) hours as needed for wheezing or shortness of breath.   ferrous sulfate 325 (65 FE) MG tablet Take 1 tablet (325 mg total) by mouth daily with breakfast.   ibuprofen 600 MG tablet Commonly known as: ADVIL Take 1 tablet (600 mg total) by mouth every 6 (six) hours.   pantoprazole 20 MG tablet Commonly known as: PROTONIX Take 1 tablet (20 mg total) by mouth daily.   prenatal multivitamin Tabs tablet Take 1 tablet by mouth at  bedtime.        Discharge home in stable condition Infant Feeding: Breast Infant Disposition:home with mother Discharge instruction: per After Visit Summary and Postpartum booklet. Activity: Advance as tolerated. Pelvic rest for 6 weeks.  Diet: routine diet Future Appointments:No future appointments. Follow up Visit: PPV in 6 weeks   10/11/2020 Linda Hedges, DO

## 2020-10-11 NOTE — Progress Notes (Signed)
Post Partum Day 2 Subjective: no complaints, up ad lib, voiding and tolerating PO  Objective: Blood pressure 92/71, pulse 76, temperature 97.9 F (36.6 C), temperature source Axillary, resp. rate 17, height 5\' 5"  (1.651 m), weight 130.1 kg, SpO2 100 %, unknown if currently breastfeeding.  Physical Exam:  General: alert, cooperative and appears stated age Lochia: appropriate Uterine Fundus: firm Incision: healing well, no significant drainage, no dehiscence DVT Evaluation: No evidence of DVT seen on physical exam. Negative Homan's sign. No cords or calf tenderness.  Recent Labs    10/10/20 0518  HGB 8.4*  HCT 25.7*    Assessment/Plan: Discharge home and Breastfeeding   LOS: 3 days   Linda Hedges 10/11/2020, 7:52 AM

## 2020-10-11 NOTE — Lactation Note (Signed)
This note was copied from a baby's chart. Lactation Consultation Note  Patient Name: Amanda Daniels WSFKC'L Date: 10/11/2020 Reason for consult: Follow-up assessment Age:35 hours   Mother has been breastfeeding and then giving Amanda Daniels . Infant has been a difficult latch. Mother has attempt a few times in the last several days. She has also pump inconsistently.  Suggested that mother try a nipple shield. Mother agreeable to the nipple shield.  Infant was fed last at 8:48 and mother will page me for next feeding.   11:57, Mother paged and infant was ready to eat. Taught mother how to apply the nipple shield and care for shield.  #24 NS placed on the Left nipple and infant placed in football hold. Infant latched on with wide mouth. Adjusted infants lips slightly. Infant was given 34 ml of DBM with the curved tip syringe. Infant sustained latch for 15-20 mins. Mother reports that she is appreciative that she was able to get infant latched with the nipple shield. Husband at side for support.   Mother plans to see [name] RN,IBCLC for follow up.   Discussed treatment and prevention of engorgement.  Plan of Care : Breastfeed infant with feeding cues Supplement infant with ebm/formula, according to supplemental guidelines. Pump using a DEBP after each feeding for 15-20 mins.   Mother to continue to cue base feed infant and feed at least 8-12 times or more in 24 hours and advised to allow for cluster feeding infant as needed.  Mother to continue to due STS. Mother is aware of available LC services at [facility], BFS[facility]'S, OP Dept, and phone # for questions or concerns about breastfeeding.  Mother receptive to all teaching and plan of care.   Maternal Data    Feeding Mother's Current Feeding Choice: Breast Milk Nipple Type: Slow - flow  LATCH Score                    Lactation Tools Discussed/Used Tools: Nipple Shields Nipple shield size: 24  Interventions     Discharge Discharge Education: Engorgement and breast care;Warning signs for feeding baby;Outpatient recommendation (plans to see [name] RN, [facility]) Pump: Manual;Personal  Consult Status Consult Status: Complete    Amanda Daniels 10/11/2020, 12:39 PM

## 2020-10-12 LAB — SURGICAL PATHOLOGY

## 2021-12-13 ENCOUNTER — Encounter: Payer: Self-pay | Admitting: Podiatry

## 2021-12-13 ENCOUNTER — Ambulatory Visit (INDEPENDENT_AMBULATORY_CARE_PROVIDER_SITE_OTHER): Payer: 59

## 2021-12-13 ENCOUNTER — Ambulatory Visit: Payer: 59

## 2021-12-13 ENCOUNTER — Ambulatory Visit: Payer: 59 | Admitting: Podiatry

## 2021-12-13 DIAGNOSIS — N949 Unspecified condition associated with female genital organs and menstrual cycle: Secondary | ICD-10-CM | POA: Insufficient documentation

## 2021-12-13 DIAGNOSIS — M722 Plantar fascial fibromatosis: Secondary | ICD-10-CM | POA: Diagnosis not present

## 2021-12-13 DIAGNOSIS — M778 Other enthesopathies, not elsewhere classified: Secondary | ICD-10-CM

## 2021-12-13 DIAGNOSIS — F988 Other specified behavioral and emotional disorders with onset usually occurring in childhood and adolescence: Secondary | ICD-10-CM | POA: Insufficient documentation

## 2021-12-13 DIAGNOSIS — J309 Allergic rhinitis, unspecified: Secondary | ICD-10-CM | POA: Insufficient documentation

## 2021-12-13 MED ORDER — TRIAMCINOLONE ACETONIDE 40 MG/ML IJ SUSP
20.0000 mg | Freq: Once | INTRAMUSCULAR | Status: AC
  Administered 2021-12-13: 20 mg

## 2021-12-13 MED ORDER — METHYLPREDNISOLONE 4 MG PO TBPK: ORAL_TABLET | ORAL | 0 refills | Status: AC

## 2021-12-13 MED ORDER — MELOXICAM 15 MG PO TABS: 15.0000 mg | ORAL_TABLET | Freq: Every day | ORAL | 3 refills | Status: AC

## 2021-12-13 NOTE — Progress Notes (Signed)
SITUATION Patient Name:  Amanda Daniels MRN:   [id number] Reason for Visit: Evaluation for foot orthotics  Patient Report: Chief Complaint: Patient would like to obtain pre-certification from their insurance regarding coverage and an out of pocket estimate before proceeding.  OBJECTIVE DATA Patient History / Diagnosis:     ICD-10-CM   1. Plantar fasciitis, left  M72.2     2. 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 Financial responsibility was discussed, and patient requested a check of benefits and an estimation of their out of pocket cost for the equipment.   PLAN patient wishes to contact her insurance herself to determine coverage and out of pocket cost estimate so that they make an informed decision regarding plan of care. In the event patient elects not to pursue orthotic treatment, referring physician is to be contacted in order to update plan of care as needed. All questions were answered and concerns addressed.

## 2021-12-13 NOTE — Progress Notes (Signed)
  Subjective:  Patient ID: Amanda Daniels, female    DOB: 1986/01/24,  MRN: [id number] HPI Chief Complaint  Patient presents with   Foot Pain    Plantar forefoot right, plantar heel left - aching x 4 months, both feet hurt in the mornings, no treatment   New Patient (Initial Visit)    36 y.o. female presents with the above complaint.   ROS: Denies fever chills nausea vomiting muscle aches pains calf pain back pain chest pain shortness of breath.  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  General: Well developed, nourished, in no acute distress, alert and oriented x3   Dermatological: Skin is warm, dry and supple bilateral. Nails x 10 are well maintained; remaining integument appears unremarkable at this time. There are no open sores, no preulcerative lesions, no rash or signs of infection present.  Vascular: Dorsalis Pedis artery and Posterior Tibial artery pedal pulses are 2/4 bilateral with immedate capillary fill time. Pedal hair growth present. No varicosities and no lower extremity edema present bilateral.   Neruologic: Grossly intact via  light touch bilateral. Vibratory intact via tuning fork bilateral. Protective threshold with Semmes Wienstein monofilament intact to all pedal sites bilateral. Patellar and Achilles deep tendon reflexes 2+ bilateral. No Babinski or clonus noted bilateral.   Musculoskeletal: No gross boney pedal deformities bilateral. No pain, crepitus, or limitation noted with foot and ankle range of motion bilateral. Muscular strength 5/5 in all groups tested bilateral.  Pain on palpation to the fourth fifth tarsometatarsal joints and metatarsal phalangeal joints of the right foot.  She does have tenderness on palpation of the plantar fascia at the calcaneal insertion site left foot.  No pain on medial-lateral compression of the calcaneus.  Gait: Unassisted, Nonantalgic.    Radiographs:  Radiographs taken today demonstrate soft tissue increase in density plantar fascial calcaneal insertion site for left foot.  No acute findings.  Assessment & Plan:   Assessment: Planter fasciitis left foot capsulitis forefoot right  Plan: I highly recommend orthotics.  I started her on methylprednisolone to be followed by meloxicam.  I injected her left heel 20 mg Kenalog 5 mg Marcaine and dispensed a plantar fascia brace.  Discussed appropriate shoe gear stretching exercises and shoe gear modifications.     Amanda Daniels, [place]

## 2021-12-13 NOTE — Progress Notes (Signed)
ERROR

## 2024-07-05 ENCOUNTER — Other Ambulatory Visit (HOSPITAL_BASED_OUTPATIENT_CLINIC_OR_DEPARTMENT_OTHER): Payer: Self-pay | Admitting: Family Medicine

## 2024-07-05 DIAGNOSIS — Z8249 Family history of ischemic heart disease and other diseases of the circulatory system: Secondary | ICD-10-CM

## 2024-07-24 ENCOUNTER — Ambulatory Visit (HOSPITAL_BASED_OUTPATIENT_CLINIC_OR_DEPARTMENT_OTHER)
Admission: RE | Admit: 2024-07-24 | Discharge: 2024-07-24 | Disposition: A | Discharge status: Home / Self Care | Payer: Self-pay | Source: Ambulatory Visit | Attending: Family Medicine | Admitting: Family Medicine

## 2024-07-24 DIAGNOSIS — Z8249 Family history of ischemic heart disease and other diseases of the circulatory system: Secondary | ICD-10-CM
